# Patient Record
Sex: Male | Born: 1950 | Race: White | Hispanic: No | Marital: Married | Smoking: Never smoker
Health system: Southern US, Community
[De-identification: ages and names within clinical notes are randomized; demographics above are authoritative.]

## PROBLEM LIST (undated history)

## (undated) DIAGNOSIS — S43006A Unspecified dislocation of unspecified shoulder joint, initial encounter: Secondary | ICD-10-CM

---

## 2006-12-13 ENCOUNTER — Emergency Department (HOSPITAL_COMMUNITY): Admission: EM | Admit: 2006-12-13 | Discharge: 2006-12-13 | Payer: Self-pay | Admitting: Emergency Medicine

## 2008-01-26 IMAGING — CR DG SHOULDER 2+V*R*
2 series · 2 of 2 positions shown · non-contrast
Comparison: Films earlier in the day.

CLINICAL DATA: 55-year-old with right shoulder dislocation. Status post reduction.  Pain. 
 RIGHT SHOULDER - 2 VIEW:

[view not recorded (1 of 2)]
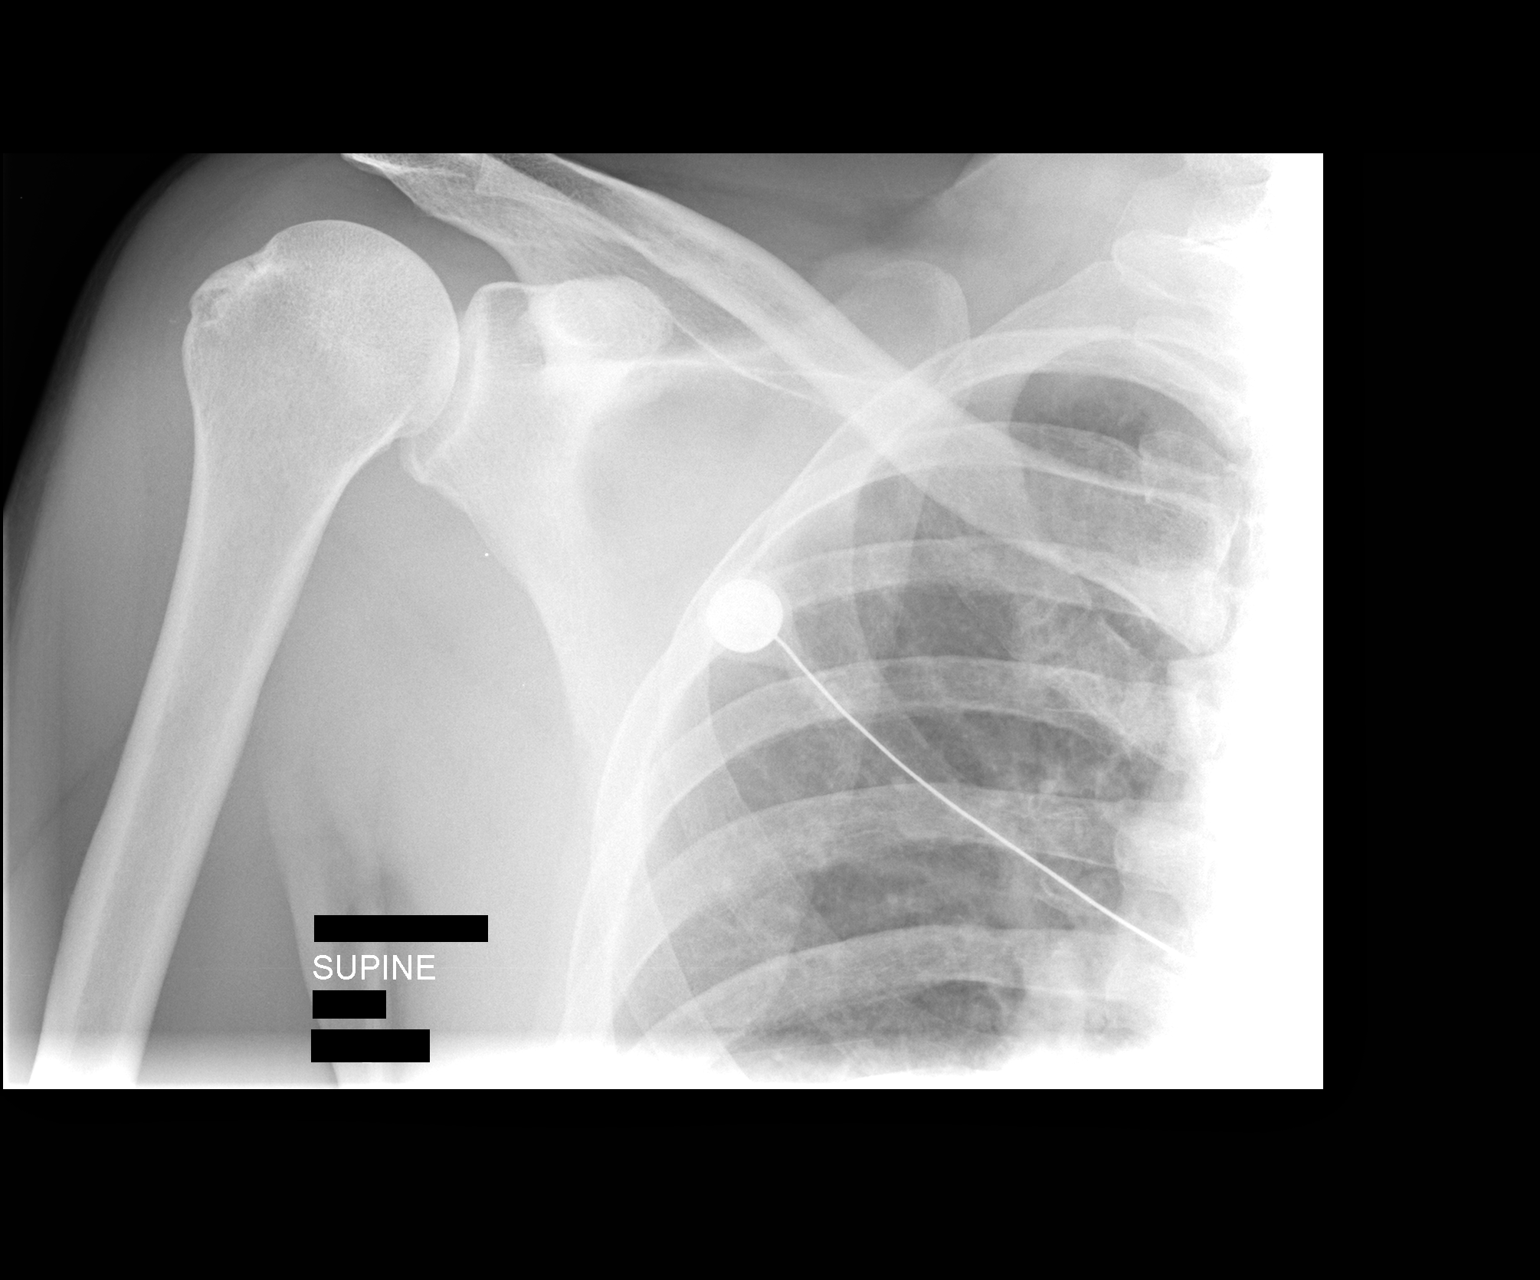

[view not recorded (2 of 2)]
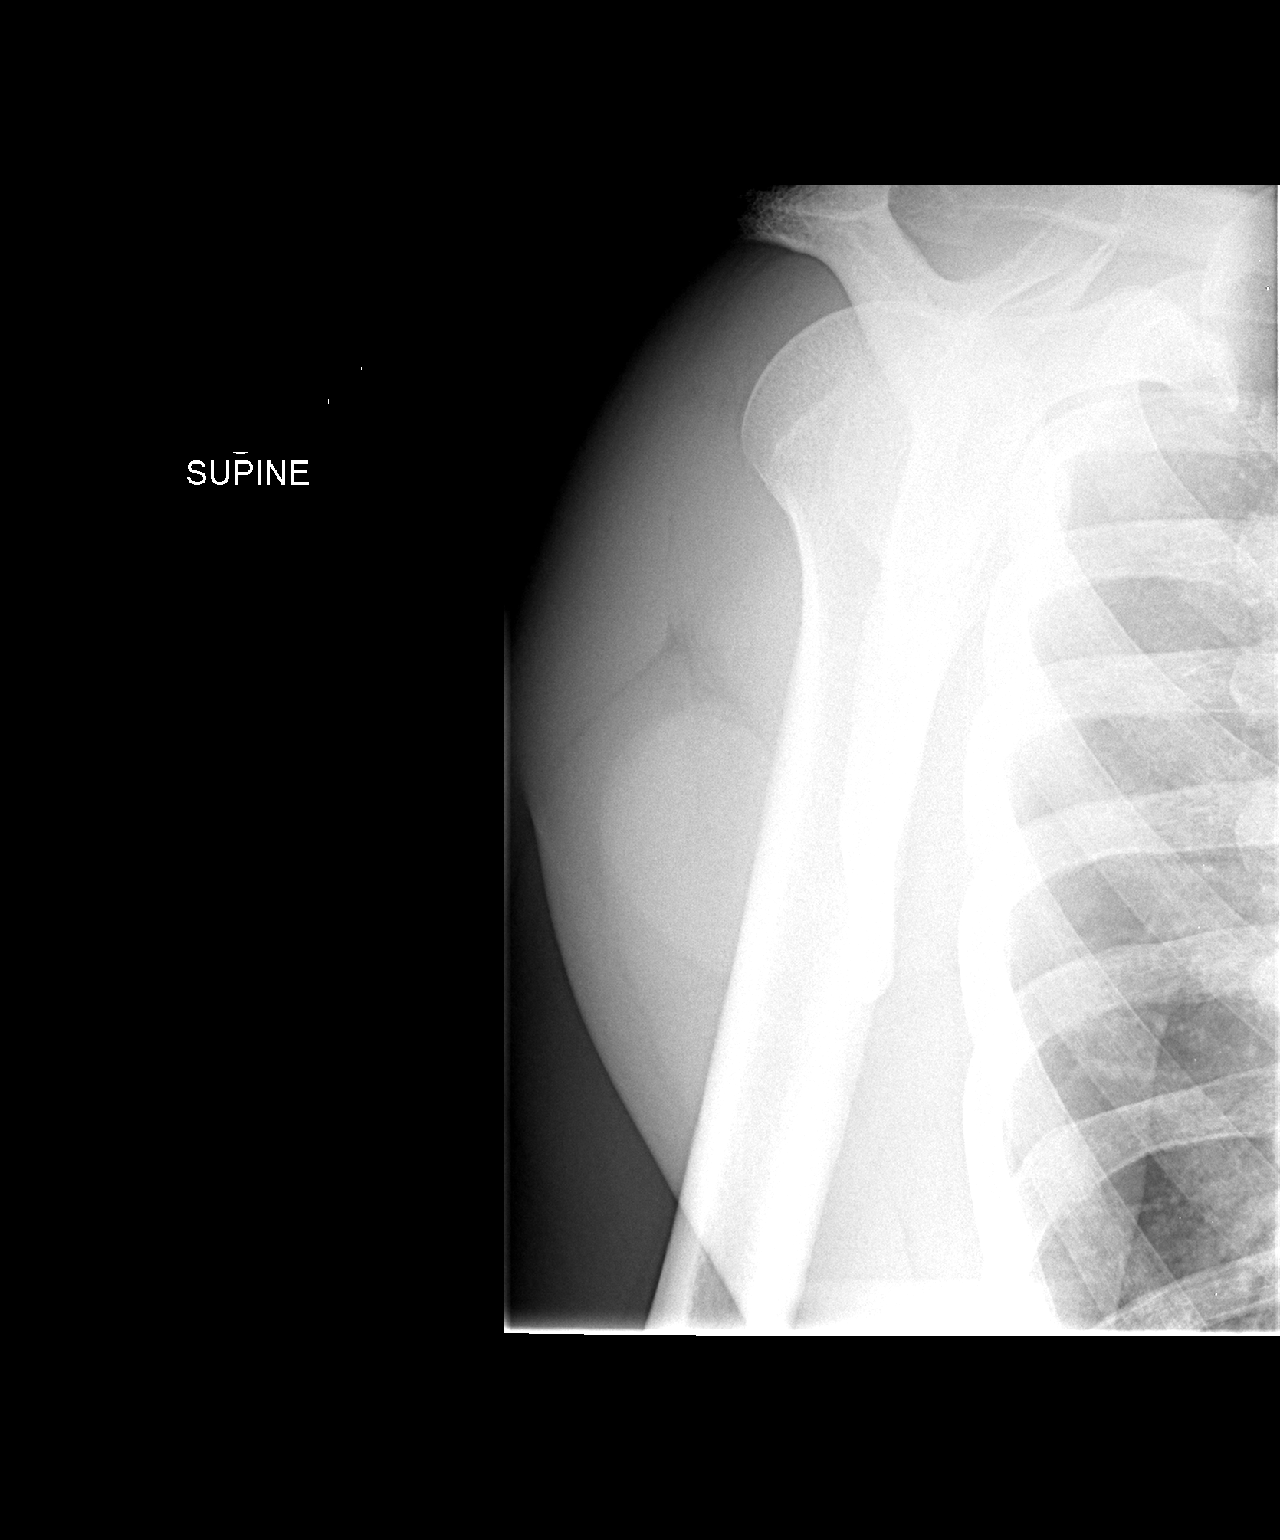

[2 of 2 positions shown; findings below may reference images not displayed]

FINDINGS: There has been interval reduction of the right shoulder.  A Hill-Sachs deformity is noted in the humeral head.
IMPRESSION: Interval reduction, right humerus.

## 2008-01-26 IMAGING — CR DG SHOULDER 2+V*R*
2 series · 2 of 2 positions shown · non-contrast
Comparison: none

CLINICAL DATA: Fall, shoulder pain

RIGHT SHOULDER - 2 VIEW

[w shoulder ap internal right *]
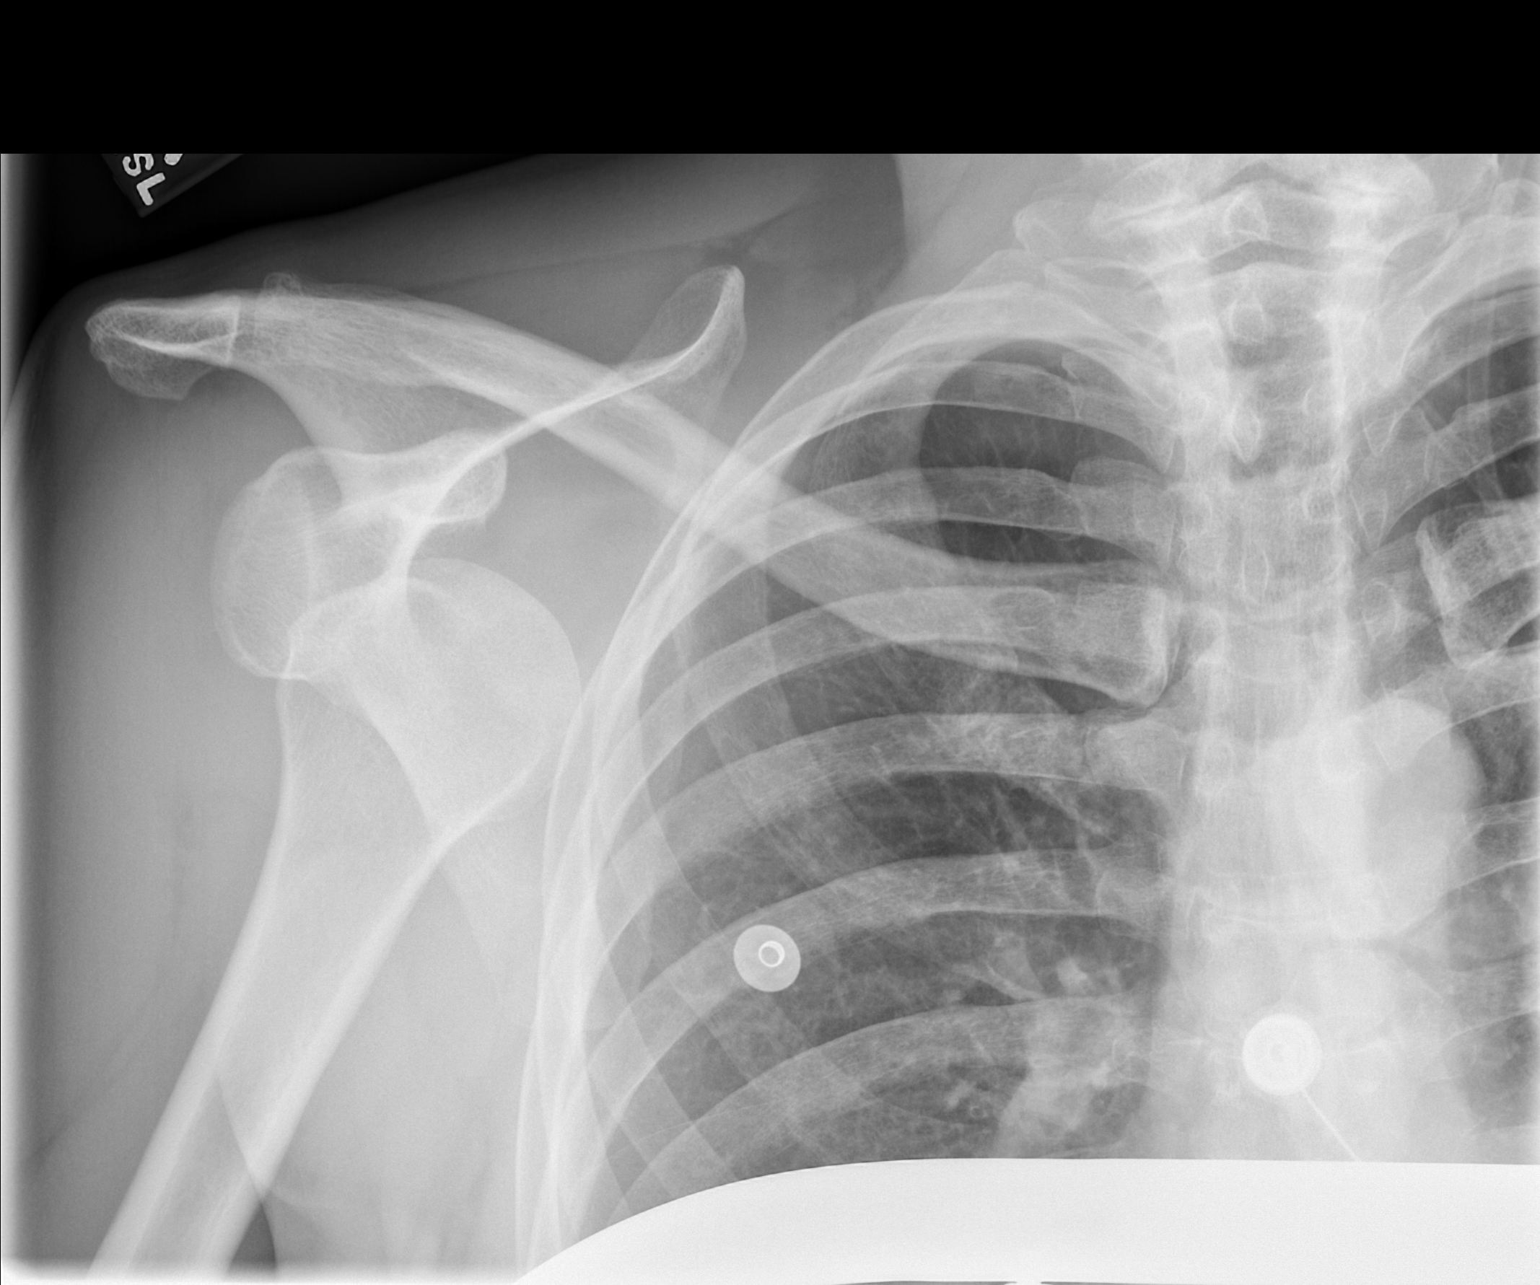

[w shoulder y view right]
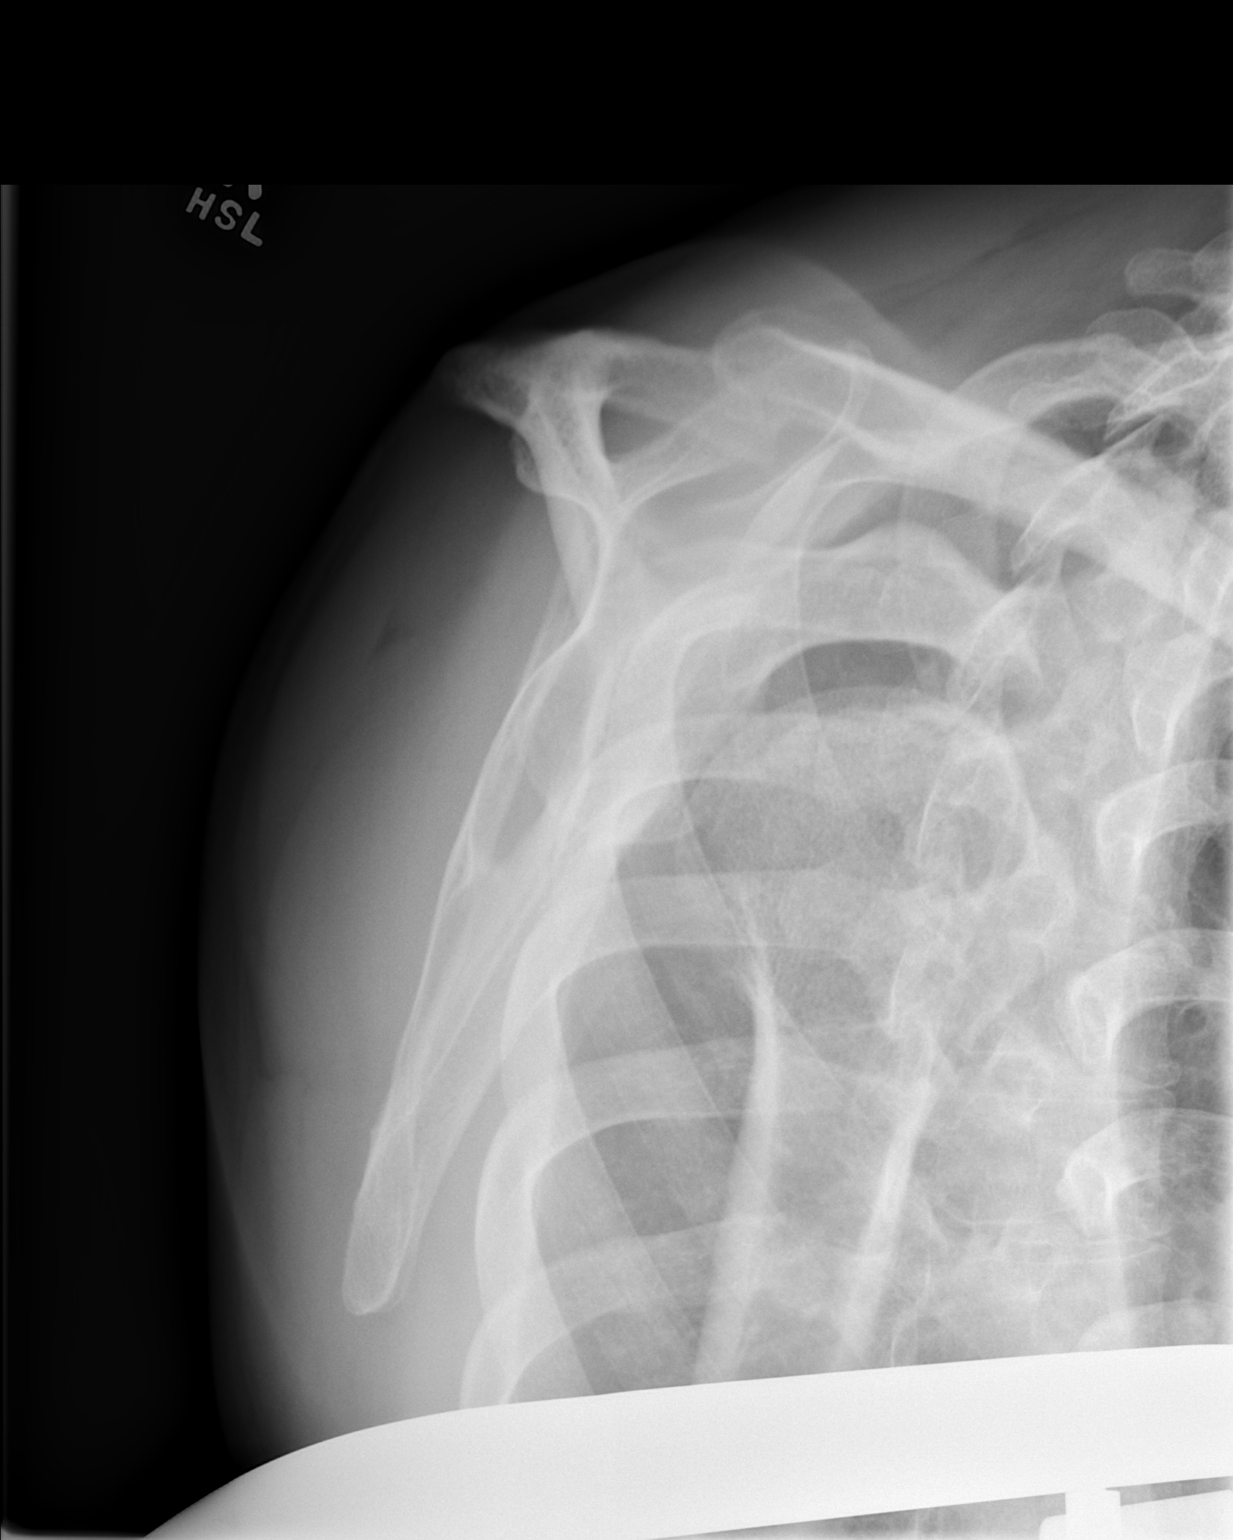

[2 of 2 positions shown; findings below may reference images not displayed]

FINDINGS: There is anterior right shoulder dislocation. No fracture seen.

IMPRESSION

Anterior right shoulder dislocation.

## 2014-04-12 ENCOUNTER — Encounter (HOSPITAL_COMMUNITY): Payer: Self-pay | Admitting: Emergency Medicine

## 2014-04-12 ENCOUNTER — Emergency Department (HOSPITAL_COMMUNITY)
Admission: EM | Admit: 2014-04-12 | Discharge: 2014-04-13 | Disposition: A | Discharge status: Home / Self Care | Payer: 59 | Attending: Emergency Medicine | Admitting: Emergency Medicine

## 2014-04-12 DIAGNOSIS — K920 Hematemesis: Secondary | ICD-10-CM | POA: Diagnosis not present

## 2014-04-12 DIAGNOSIS — Z8781 Personal history of (healed) traumatic fracture: Secondary | ICD-10-CM | POA: Diagnosis not present

## 2014-04-12 DIAGNOSIS — R110 Nausea: Secondary | ICD-10-CM | POA: Insufficient documentation

## 2014-04-12 HISTORY — DX: Unspecified dislocation of unspecified shoulder joint, initial encounter: S43.006A

## 2014-04-12 NOTE — ED Notes (Signed)
Pt states he has a hx where he often vomits if he eats too fast. Tonight this happened while he was eating and he had one episode where he vomited bright red blood. Pt has not vomited since. Denies n/v now.

## 2014-04-13 ENCOUNTER — Emergency Department (HOSPITAL_COMMUNITY): Payer: 59

## 2014-04-13 LAB — I-STAT CHEM 8, ED
BUN: 19 mg/dL (ref 6–23)
CREATININE: 0.9 mg/dL (ref 0.50–1.35)
Calcium, Ion: 1.17 mmol/L (ref 1.13–1.30)
Chloride: 102 mEq/L (ref 96–112)
GLUCOSE: 96 mg/dL (ref 70–99)
HCT: 47 % (ref 39.0–52.0)
Hemoglobin: 16 g/dL (ref 13.0–17.0)
Potassium: 4.1 mEq/L (ref 3.7–5.3)
SODIUM: 140 meq/L (ref 137–147)
TCO2: 26 mmol/L (ref 0–100)

## 2014-04-13 LAB — I-STAT TROPONIN, ED: Troponin i, poc: 0 ng/mL (ref 0.00–0.08)

## 2014-04-13 MED ORDER — OMEPRAZOLE 20 MG PO CPDR: 20.0000 mg | DELAYED_RELEASE_CAPSULE | Freq: Every day | ORAL | Status: AC

## 2014-04-13 NOTE — Discharge Instructions (Signed)
Hematemesis °This condition is the vomiting of blood. °CAUSES  °This can happen if you have a peptic ulcer or an irritation of the throat, stomach, or small bowel. Vomiting over and over again or swallowing blood from a nosebleed, coughing or facial injury can also result in bloody vomit. Anti-inflammatory pain medicines are a common cause of this potentially dangerous condition. The most serious causes of vomiting blood include: °· Ulcers (a bacteria called H. pylori is common cause of ulcers). °· Clotting problems. °· Alcoholism. °· Cirrhosis. °TREATMENT  °Treatment depends on the cause and the severity of the bleeding. Small amounts of blood streaks in the vomit is not the same as vomiting large amounts of bloody or dark, coffee grounds-like material. Weakness, fainting, dehydration, anemia, and continued alcohol or drug use increase the risk. Examination may include blood, vomit, or stool tests. The presence of bloody or dark stool that tests positive for blood (Hemoccult) means the bleeding has been going on for some time. Endoscopy and imaging studies may be done. Emergency treatment may include: °· IV medicines or fluids. °· Blood transfusions. °· Surgery. °Hospital care is required for high risk patients or when IV fluids or blood is needed. Upper GI bleeding can cause shock and death if not controlled. °HOME CARE INSTRUCTIONS  °· Your treatment does not require hospital care at this time. °· Remain at rest until your condition improves. °· Drink clear liquids as tolerated. °· Avoid: °¨ Alcohol. °¨ Nicotine. °¨ Aspirin. °¨ Any other anti-inflammatory medicine (ibuprofen, naproxen, and many others). °· Medications to suppress stomach acid or vomiting may be needed. Take all your medicine as prescribed. °· Be sure to see your caregiver for follow-up as recommended. °SEEK IMMEDIATE MEDICAL CARE IF:  °· You have repeated vomiting, dehydration, fainting, or extreme weakness. °· You are vomiting large amounts of  bloody or dark material. °· You pass large, dark or bloody stools. °Document Released: 10/10/2004 Document Revised: 11/25/2011 Document Reviewed: 10/26/2008 °ExitCare® Patient Information ©2015 [employer]. This information is not intended to replace advice given to you by your health care provider. Make sure you discuss any questions you have with your health care provider. ° °

## 2014-04-13 NOTE — ED Provider Notes (Signed)
TIME SEEN: 12:09 AM  CHIEF COMPLAINT: Hematemesis  HPI: Patient is a 63 year old male with no significant past medical history who presents to the emergency department with complaints of hematemesis. He states that he often vomits if he eats too fast as he gets a fullness in his epigastric and chest region that is relieved with vomiting. He states this happened tonight after eating and he vomited in one episode had several teaspoonfuls of bright red blood. He states after he vomited he felt much better. He is having some epigastric and chest "soreness". No shortness of breath, diaphoresis or dizziness. Denies a history of prior GI bleed, anticoagulation use, heavy alcohol or NSAID use, peptic ulcer disease, H. pylori. Denies any bloody stool or melena. He states he is feeling much better but then he started to look at his symptoms on the Internet which made him concerned and that is why he came to the emergency department.  ROS: See HPI Constitutional: no fever  Eyes: no drainage  ENT: no runny nose   Cardiovascular:  no chest pain  Resp: no SOB  GI:  vomiting GU: no dysuria Integumentary: no rash  Allergy: no hives  Musculoskeletal: no leg swelling  Neurological: no slurred speech ROS otherwise negative  PAST MEDICAL HISTORY/PAST SURGICAL HISTORY:  Past Medical History  Diagnosis Date  . Dislocated shoulder     MEDICATIONS:  Prior to Admission medications   Not on File    ALLERGIES:  No Known Allergies  SOCIAL HISTORY:  History  Substance Use Topics  . Smoking status: Never Smoker   . Smokeless tobacco: Never Used  . Alcohol Use: No    FAMILY HISTORY: No family history on file.  EXAM: BP 120/62  Pulse 65  Temp(Src) 97.8 F (36.6 C) (Oral)  Resp 18  Ht 5' 6.5" (1.689 m)  Wt 135 lb (61.236 kg)  BMI 21.47 kg/m2  SpO2 97% CONSTITUTIONAL: Alert and oriented and responds appropriately to questions. Well-appearing; well-nourished HEAD: Normocephalic EYES:  Conjunctivae clear, PERRL ENT: normal nose; no rhinorrhea; moist mucous membranes; pharynx without lesions noted NECK: Supple, no meningismus, no LAD  CARD: RRR; S1 and S2 appreciated; no murmurs, no clicks, no rubs, no gallops RESP: Normal chest excursion without splinting or tachypnea; breath sounds clear and equal bilaterally; no wheezes, no rhonchi, no rales,  ABD/GI: Normal bowel sounds; non-distended; soft, non-tender, no rebound, no guarding BACK:  The back appears normal and is non-tender to palpation, there is no CVA tenderness EXT: Normal ROM in all joints; non-tender to palpation; no edema; normal capillary refill; no cyanosis    SKIN: Normal color for age and race; warm NEURO: Moves all extremities equally PSYCH: The patient's mood and manner are appropriate. Grooming and personal hygiene are appropriate.  MEDICAL DECISION MAKING: Patient here with one episode of hematemesis. He is otherwise well-appearing in no complaints of chest soreness currently. Episode happened around 7 to 8 PM tonight. 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 Patient is an avid cycler and states he normally rides 1000 miles in the summer and never develops any symptoms during his activity including chest pain or chest discomfort, shortness of breath, nausea vomiting, diaphoresis or dizziness. His acute abdominal series shows no signs of free air under the diaphragm to suggest a perforated ulcer. He denies wanting GI cocktail, Zofran at this time. Suspect possible esophageal irritation, Mallory-Weiss tears. I am not concerned for esophageal rupture. Very low suspicion for ACS but labs  including one troponin pending given his abnormal EKG with no old for comparison.  ED PROGRESS: Patient's labs are unremarkable. Troponin negative. Given this episode happened ever 5 hours ago and he has a negative troponin, if he is safe to be discharged home. I also discussed with patient and feels  very likely that he has a significant coronary artery disease as he is able to vigerously exercise on a regular basis without becoming symptomatic.  We'll discharge patient home with prescription for omeprazole. We'll have him follow up with gastroenterology if symptoms continue. Have discussed return precautions and supportive care instructions. He verbalizes understanding and is comfortable plan.     EKG Interpretation  Date/Time:  Wednesday April 13 2014 00:25:38 EDT Ventricular Rate:  57 PR Interval:  144 QRS Duration: 152 QT Interval:  443 QTC Calculation: 431 R Axis:   121 Text Interpretation:  Sinus rhythm RBBB and LPFB No old for comparison Confirmed by Lorijean Husser,  DO, Caela Huot [phone]([id number]) on 04/13/2014 12:36:28 AM         Layla MawKristen N Xaine Sansom, DO 04/13/14 0122

## 2020-01-10 ENCOUNTER — Ambulatory Visit: Payer: Self-pay | Attending: Internal Medicine

## 2020-01-10 DIAGNOSIS — Z23 Encounter for immunization: Secondary | ICD-10-CM

## 2020-01-10 NOTE — Progress Notes (Signed)
   Covid-19 Vaccination Clinic  Name:  Dominic Marks    MRN: [id number] DOB: May 20, 1951  01/10/2020  Mr. [name] was observed post Covid-19 immunization for 15 minutes without incident. He was provided with Vaccine Information Sheet and instruction to access the V-Safe system.   Mr. [name] was instructed to call 911 with any severe reactions post vaccine: Marland Kitchen Difficulty breathing  . Swelling of face and throat  . A fast heartbeat  . A bad rash all over body  . Dizziness and weakness   Immunizations Administered    Name Date Dose VIS Date Route   Pfizer COVID-19 Vaccine 01/10/2020  4:02 PM 0.3 mL 11/10/2018 Intramuscular   Manufacturer: [employer], [employer]   Lot: [id number]   NDC: [address]-9

## 2020-01-31 ENCOUNTER — Ambulatory Visit: Payer: Self-pay | Attending: Internal Medicine

## 2020-01-31 DIAGNOSIS — Z23 Encounter for immunization: Secondary | ICD-10-CM

## 2020-01-31 NOTE — Progress Notes (Signed)
   Covid-19 Vaccination Clinic  Name:  Dominic Marks    MRN: [id number] DOB: [birth year]  01/31/2020  [name] was observed post Covid-19 immunization for 15 minutes without incident. He was provided with Vaccine Information Sheet and instruction to access the V-Safe system.   [name] was instructed to call 911 with any severe reactions post vaccine: Marland Kitchen Difficulty breathing  . Swelling of face and throat  . A fast heartbeat  . A bad rash all over body  . Dizziness and weakness   Immunizations Administered    Name Date Dose VIS Date Route   Pfizer COVID-19 Vaccine 01/31/2020  4:16 PM 0.3 mL 11/10/2018 Intramuscular   Manufacturer: [employer], [employer]   Lot: [id number]   NDC: [address]-7

## 2020-08-08 ENCOUNTER — Ambulatory Visit: Payer: Medicare Other | Attending: Internal Medicine

## 2020-08-08 DIAGNOSIS — Z23 Encounter for immunization: Secondary | ICD-10-CM

## 2020-08-08 NOTE — Progress Notes (Signed)
   Covid-19 Vaccination Clinic  Name:  Dominic Marks    MRN: [id number] DOB: December 19, 1950  08/08/2020  [name] was observed post Covid-19 immunization for 15 minutes without incident. He was provided with Vaccine Information Sheet and instruction to access the V-Safe system.   [name] was instructed to call 911 with any severe reactions post vaccine: Marland Kitchen Difficulty breathing  . Swelling of face and throat  . A fast heartbeat  . A bad rash all over body  . Dizziness and weakness   Immunizations Administered    Name Date Dose VIS Date Route   Pfizer COVID-19 Vaccine 08/08/2020  1:38 PM 0.3 mL 07/05/2020 Intramuscular   Manufacturer: [employer], [employer]   Lot: [id number]   NDC: [address]-1

## 2023-01-02 ENCOUNTER — Other Ambulatory Visit: Payer: Self-pay

## 2023-01-02 DIAGNOSIS — M79672 Pain in left foot: Secondary | ICD-10-CM

## 2023-01-15 ENCOUNTER — Ambulatory Visit (HOSPITAL_BASED_OUTPATIENT_CLINIC_OR_DEPARTMENT_OTHER)
Admission: RE | Admit: 2023-01-15 | Discharge: 2023-01-15 | Disposition: A | Discharge status: Home / Self Care | Payer: Medicare Other | Source: Ambulatory Visit | Attending: Orthopaedic Surgery | Admitting: Orthopaedic Surgery

## 2023-01-15 DIAGNOSIS — M79672 Pain in left foot: Secondary | ICD-10-CM | POA: Diagnosis present

## 2023-06-27 ENCOUNTER — Other Ambulatory Visit: Payer: Self-pay | Admitting: Orthopaedic Surgery

## 2023-06-27 DIAGNOSIS — M25562 Pain in left knee: Secondary | ICD-10-CM

## 2023-07-08 ENCOUNTER — Ambulatory Visit
Admission: RE | Admit: 2023-07-08 | Discharge: 2023-07-08 | Disposition: A | Discharge status: Home / Self Care | Payer: Medicare Other | Source: Ambulatory Visit | Attending: Orthopaedic Surgery | Admitting: Orthopaedic Surgery

## 2023-07-08 DIAGNOSIS — M25562 Pain in left knee: Secondary | ICD-10-CM

## 2023-08-26 ENCOUNTER — Encounter: Payer: Self-pay | Admitting: Podiatry

## 2023-08-26 ENCOUNTER — Ambulatory Visit (INDEPENDENT_AMBULATORY_CARE_PROVIDER_SITE_OTHER): Payer: Medicare Other

## 2023-08-26 ENCOUNTER — Ambulatory Visit (INDEPENDENT_AMBULATORY_CARE_PROVIDER_SITE_OTHER): Payer: Medicare Other | Admitting: Podiatry

## 2023-08-26 VITALS — Ht 66.5 in | Wt 135.0 lb

## 2023-08-26 DIAGNOSIS — M21272 Flexion deformity, left ankle and toes: Secondary | ICD-10-CM | POA: Diagnosis not present

## 2023-08-26 DIAGNOSIS — M2142 Flat foot [pes planus] (acquired), left foot: Secondary | ICD-10-CM | POA: Diagnosis not present

## 2023-08-26 DIAGNOSIS — M778 Other enthesopathies, not elsewhere classified: Secondary | ICD-10-CM | POA: Diagnosis not present

## 2023-08-26 DIAGNOSIS — M6281 Muscle weakness (generalized): Secondary | ICD-10-CM

## 2023-08-26 NOTE — Progress Notes (Signed)
  Subjective:  Patient ID: Dominic Marks, male    DOB: 01/20/1951,  MRN: [id number]  Chief Complaint  Patient presents with   Foot Pain    RM2: pain primarily upper part of left foot...    Discussed the use of AI scribe software for clinical note transcription with the patient, who gave verbal consent to proceed.  History of Present Illness   The patient, with a history of two bunion surgeries, presents with persistent foot pain. The first surgery resulted in a nonunion, while the second was initially successful. However, the patient reports that since the last surgery around [facility], he has experienced significant discomfort in the foot and knee. The patient describes the foot pain as a strain-like sensation, primarily on the top of the foot, which intensifies with physical activity such as hiking. The pain sometimes presents spontaneously, described as an intense nerve-like sensation. The patient also reports occasional swelling on the side of the foot. The patient attempted to use shoe inserts, which seemed to exacerbate the issue. The patient also mentions sensitivity around the hardware from the bunion surgeries.          Objective:    Physical Exam   MUSCULOSKELETAL: Well-healed surgical scar from first MTP arthrodesis. Tenderness along the EHL with range of motion of the IPJ. Mild midfoot tenderness and tenderness on the PD tendon and insertion. Immediate post-surgical valgus deformity. No severe pain in the second or third metatarsal to indicate active stress fracture.       No images are attached to the encounter.    Results   RADIOLOGY Left foot X-ray: Well-healed first metatarsal phalangeal arthrodesis with plate and screw fixation. Metatarsus primus elevatus present. Mild pes planus deformity.      Assessment:   1. Capsulitis of left foot   2. Metatarsus primus elevatus, left   3. Acquired pes planus, left   4. Muscle weakness      Plan:  Patient was  evaluated and treated and all questions answered.  Assessment and Plan    Post-operative foot pain   Following bunion surgeries, the most recent in May 2024, he experiences pain and discomfort primarily on the top of the foot, worsening with physical activity. Tenderness is noted along the Extensor Hallucis Longus (EHL) during range of motion of the Interphalangeal Joint (IPJ) and upon palpation over the hardware, alongside mild midfoot tenderness and tenderness on the Posterior Tibial (PT) tendon and its insertion. We will refer him to an orthotist for custom molded inserts to support the ankle and alleviate tendon strain. Should pain persist, consideration for hardware removal is advised. We also discussed physical therapy for conditioning and strengthening and a referral was sent.  Pes Planus (Flatfoot)   His flatter foot, where the arch rolls in, adds strain on the PT tendon. Custom molded inserts will be used to support the arch and prevent the foot from rolling in.  Metatarsus Primus Elevatus   The elevation of the first metatarsal bone may be contributing to his foot pain and strain. Custom molded inserts will also address this by balancing the back of the ankle and the middle of the foot.  Follow-up   An appointment with the orthotist will be scheduled to discuss and create custom molded inserts. He should bring his current inserts for assessment. A future consultation for hardware removal may be considered if necessary.          No follow-ups on file.

## 2023-09-16 ENCOUNTER — Ambulatory Visit: Payer: Medicare Other | Attending: Podiatry | Admitting: Physical Therapy

## 2023-09-16 ENCOUNTER — Encounter: Payer: Self-pay | Admitting: Physical Therapy

## 2023-09-16 ENCOUNTER — Other Ambulatory Visit: Payer: Self-pay

## 2023-09-16 DIAGNOSIS — M25572 Pain in left ankle and joints of left foot: Secondary | ICD-10-CM | POA: Diagnosis present

## 2023-09-16 DIAGNOSIS — M6281 Muscle weakness (generalized): Secondary | ICD-10-CM

## 2023-09-16 DIAGNOSIS — M25562 Pain in left knee: Secondary | ICD-10-CM | POA: Insufficient documentation

## 2023-09-16 DIAGNOSIS — G8929 Other chronic pain: Secondary | ICD-10-CM | POA: Diagnosis present

## 2023-09-16 NOTE — Patient Instructions (Signed)
 Access Code: [email or web address] URL: [email or web address] Date: 09/16/2023 Prepared by: Elaine Daring  Exercises - Seated Figure 4 Ankle Inversion with Resistance  - 1 x daily - 3 sets - 15-20 reps - Long Sitting Ankle Eversion with Resistance  - 1 x daily - 3 sets - 15-20 reps - Supine Bridge with Mini Swiss Ball Between Knees  - 1 x daily - 2 sets - 10 reps - 5 seconds hold - Side Plank on Knees  - 1 x daily - 2 sets - 10 reps - 5 seconds hold

## 2023-09-16 NOTE — Therapy (Signed)
 OUTPATIENT PHYSICAL THERAPY EVALUATION   Patient Name: Dominic Marks MRN: [id number] DOB:1951/02/24, 72 y.o., male Today's Date: 09/16/2023   END OF SESSION:  PT End of Session - 09/16/23 1205     Visit Number 1    Number of Visits 9    Date for PT Re-Evaluation 11/11/23    Authorization Type MCR / BCBS    Progress Note Due on Visit 10    PT Start Time 0845    PT Stop Time 0935    PT Time Calculation (min) 50 min    Activity Tolerance Patient tolerated treatment well    Behavior During Therapy WFL for tasks assessed/performed             Past Medical History:  Diagnosis Date   Dislocated shoulder    History reviewed. No pertinent surgical history. There are no active problems to display for this patient.   PCP: Tanda Prentice DEL, MD  REFERRING PROVIDER: Silva Juliene SAUNDERS, DPM  REFERRING DIAG: Muscle weakness  THERAPY DIAG:  Pain in left ankle and joints of left foot  Chronic pain of left knee  Muscle weakness (generalized)  Rationale for Evaluation and Treatment: Rehabilitation  ONSET DATE: November 2023   SUBJECTIVE:  SUBJECTIVE STATEMENT: Patient reports he had a bunion surgery in November 2023, nonunion and had a re-do in May 2024. As a consequence of the bunion surgery he is experiencing significant pain in the left foot and meniscus tears in his left knee. He feels he has lost a lot of muscle which is putting more stress on the left foot. He does wear decent shoes and only does some low grade hiking for exercises. He does constantly feel pain with walking and whenever he gets up from sitting it takes about 20 steps for the ankle/foot to warm up.   PERTINENT HISTORY: Two previous left bunion surgeries, left meniscus tear  PAIN:  Are you having pain? Yes:  NPRS scale: 5/10 Pain location: Left foot, knee Pain description: Aching, sharp Aggravating factors: Walking, standing Relieving factors: Rest  PRECAUTIONS: None  RED FLAGS: None   WEIGHT  BEARING RESTRICTIONS: No  FALLS:  Has patient fallen in last 6 months? No  PLOF: Independent  PATIENT GOALS: Pain relief so can improve activity level   OBJECTIVE:  Note: Objective measures were completed at Evaluation unless otherwise noted. PATIENT SURVEYS:  FOTO 57% functional status  COGNITION: Overall cognitive status: Within functional limits for tasks assessed     SENSATION: WFL  MUSCLE LENGTH: Left calf tightness  POSTURE:   Patient demonstrates pes planus of left foot  PALPATION: Tender to palpation across   LOWER EXTREMITY ROM:  Active ROM Right eval Left eval  Hip flexion    Hip extension    Hip abduction    Hip adduction    Hip internal rotation    Hip external rotation    Knee flexion  135  Knee extension  0  Ankle dorsiflexion  8  Ankle plantarflexion  55  Ankle inversion  35  Ankle eversion  15   (Blank rows = not tested)  Patient with fusion 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 so unable to flex/extend joint  LOWER EXTREMITY MMT:  MMT Right eval Left eval  Hip flexion    Hip extension 4- 4-  Hip abduction 4 4-  Hip adduction 4 4  Hip internal rotation    Hip external rotation    Knee flexion 5 5  Knee extension 5 5  Ankle  dorsiflexion 5 5  Ankle plantarflexion 5 4  Ankle inversion 5 4  Ankle eversion 5 4   (Blank rows = not tested)  FUNCTIONAL TESTS:  Not assessed  GAIT: Assistive device utilized: None Level of assistance: Complete Independence Comments: Left toe out, impaired toe off on left                                                                                                              TREATMENT DATE:  [facility] Adult PT Treatment:                                                DATE: 09/16/2023 Therapeutic Exercise: Seated figure-4 ankle inversion with red  Longsitting ankle eversion with red Bridge with 5 sec hold Modified side plank lift on knee with 5 sec hold  PATIENT EDUCATION:  Education details: Exam findings,  POC, HEP, possible use of shoe insert or rockerbottom shoe due to 1st MTP fusion Person educated: Patient Education method: Programmer, Multimedia, Demonstration, Tactile cues, Verbal cues, and Handouts Education comprehension: verbalized understanding, returned demonstration, verbal cues required, tactile cues required, and needs further education  HOME EXERCISE PROGRAM: Access Code: [email or web address]    ASSESSMENT: CLINICAL IMPRESSION: Patient is a 72 y.o. male who was seen today for physical therapy evaluation and treatment for left foot and knee pain with left LE weakness following multiple left bunion surgeries and left knee meniscus tear. He primarily reports pain across the metatarsals on the left foot and pain along the medial aspect of the left arch and ankle. He does exhibit some strength deficits of the left ankle and LE compared to the right side, and exhibits limitations in his ankle mobility and fusion of the left 1st MTP this is likely a main contributor to his symptoms due to gait and movement changes. Patient does report he is likely to schedule surgery for hardware removal of left bunion surgery and left meniscectomy.    OBJECTIVE IMPAIRMENTS: Abnormal gait, decreased activity tolerance, decreased balance, decreased ROM, decreased strength, impaired flexibility, and pain.   ACTIVITY LIMITATIONS: standing and locomotion level  PARTICIPATION LIMITATIONS: community activity  PERSONAL FACTORS: Past/current experiences and Time since onset of injury/illness/exacerbation are also affecting patient's functional outcome.   REHAB POTENTIAL: Good  CLINICAL DECISION MAKING: Stable/uncomplicated  EVALUATION COMPLEXITY: Low   GOALS: Goals reviewed with patient? Yes  SHORT TERM GOALS: Target date: 10/14/2023  Patient will be I with initial HEP in order to progress with therapy. Baseline: HEP provided at eval Goal status: INITIAL  2.  Patient will report left foot and knee pain </= 2/10 with  walking in order to reduce functional limitations Baseline: 5/10 pain Goal status: INITIAL  LONG TERM GOALS: Target date: 11/11/2023  Patient will be I with final HEP to maintain progress from PT. Baseline: HEP provided at eval Goal status: INITIAL  2.  Patient will report >/= 70%  status on FOTO to indicate improved functional ability. Baseline: 57% functional status Goal status: INITIAL  3.  Patient will demonstrate left ankle strength 5/5 MMT in order to improve standing and walking tolerance Baseline: see limitations above Goal status: INITIAL  4.  Patient will demonstrate left hip strength >/= 4/5 MMT in order to improve activity tolerance Baseline: see limitations above Goal status: INITIAL   PLAN: PT FREQUENCY: 1x/week  PT DURATION: 8 weeks  PLANNED INTERVENTIONS: 97164- PT Re-evaluation, 97110-Therapeutic exercises, 97530- Therapeutic activity, 97112- Neuromuscular re-education, 97535- Self Care, 02859- Manual therapy, [id number]- Gait training, 97014- Electrical stimulation (unattended), [phone]- Electrical stimulation (manual), [phone]- Ionotophoresis 4mg /ml Dexamethasone, Patient/Family education, Balance training, Taping, Dry Needling, Joint mobilization, Joint manipulation, Cryotherapy, and Moist heat  PLAN FOR NEXT SESSION: Review HEP and progress PRN, progress strengthening for left ankle, knee, hip, core; ankle and foot mobility; progress to standing control exercises   Elaine Daring, PT, DPT, LAT, ATC 09/16/23  12:59 PM Phone: [phone] Fax: [phone]

## 2023-09-24 ENCOUNTER — Other Ambulatory Visit: Payer: Self-pay

## 2023-09-24 ENCOUNTER — Encounter: Payer: Self-pay | Admitting: Physical Therapy

## 2023-09-24 ENCOUNTER — Ambulatory Visit: Payer: Medicare Other | Attending: Podiatry | Admitting: Physical Therapy

## 2023-09-24 DIAGNOSIS — G8929 Other chronic pain: Secondary | ICD-10-CM | POA: Insufficient documentation

## 2023-09-24 DIAGNOSIS — M6281 Muscle weakness (generalized): Secondary | ICD-10-CM | POA: Insufficient documentation

## 2023-09-24 DIAGNOSIS — M25562 Pain in left knee: Secondary | ICD-10-CM | POA: Diagnosis present

## 2023-09-24 DIAGNOSIS — M25572 Pain in left ankle and joints of left foot: Secondary | ICD-10-CM | POA: Diagnosis present

## 2023-09-24 NOTE — Patient Instructions (Signed)
 Access Code: [email or web address] URL: [email or web address] Date: 09/24/2023 Prepared by: Elaine Daring  Exercises - Seated Figure 4 Ankle Inversion with Resistance  - 1 x daily - 3 sets - 20 reps - Long Sitting Ankle Eversion with Resistance  - 1 x daily - 3 sets - 20 reps - Long Sitting Ankle Plantar Flexion with Resistance  - 1 x daily - 3 sets - 20 reps - Supine Bridge with Mini Swiss Ball Between Knees  - 1 x daily - 2 sets - 10 reps - 5 seconds hold - Side Plank with Clam and Resistance  - 1 x daily - 2 sets - 10 reps - Single Leg Stance  - 1 x daily - 3 reps - 30 seconds hold

## 2023-09-24 NOTE — Therapy (Signed)
 OUTPATIENT PHYSICAL THERAPY TREATMENT   Patient Name: Dominic Marks MRN: [id number] DOB:06/21/1951, 73 y.o., male Today's Date: 09/24/2023   END OF SESSION:  PT End of Session - 09/24/23 0856     Visit Number 2    Number of Visits 9    Date for PT Re-Evaluation 11/11/23    Authorization Type MCR / BCBS    Progress Note Due on Visit 10    PT Start Time 0845    PT Stop Time 0928    PT Time Calculation (min) 43 min    Activity Tolerance Patient tolerated treatment well    Behavior During Therapy [facility] for tasks assessed/performed              Past Medical History:  Diagnosis Date   Dislocated shoulder    History reviewed. No pertinent surgical history. There are no active problems to display for this patient.   PCP: Tanda Prentice DEL, MD  REFERRING PROVIDER: Silva Juliene SAUNDERS, DPM  REFERRING DIAG: Muscle weakness  THERAPY DIAG:  Pain in left ankle and joints of left foot  Chronic pain of left knee  Muscle weakness (generalized)  Rationale for Evaluation and Treatment: Rehabilitation  ONSET DATE: November 2023   SUBJECTIVE:  SUBJECTIVE STATEMENT: Patient reports the ankle strengthening exercises are going well. The core and hip exercises he can't tell much yet.   EVAL: Patient reports he had a bunion surgery in November 2023, nonunion and had a re-do in May 2024. As a consequence of the bunion surgery he is experiencing significant pain in the left foot and meniscus tears in his left knee. He feels he has lost a lot of muscle which is putting more stress on the left foot. He does wear decent shoes and only does some low grade hiking for exercises. He does constantly feel pain with walking and whenever he gets up from sitting it takes about 20 steps for the ankle/foot to warm up.   PERTINENT HISTORY: Two previous left bunion surgeries, left meniscus tear  PAIN:  Are you having pain? Yes:  NPRS scale: 5/10 Pain location: Left foot, knee Pain description:  Aching, sharp Aggravating factors: Walking, standing Relieving factors: Rest  PRECAUTIONS: None  PATIENT GOALS: Pain relief so can improve activity level   OBJECTIVE:  Note: Objective measures were completed at Evaluation unless otherwise noted. 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 so unable to flex/extend joint  LOWER EXTREMITY MMT:  MMT Right eval Left eval  Hip flexion    Hip extension 4- 4-  Hip abduction 4 4-  Hip adduction 4 4  Hip internal rotation    Hip external rotation    Knee flexion 5 5  Knee extension 5 5  Ankle dorsiflexion 5 5  Ankle plantarflexion 5 4  Ankle inversion 5 4  Ankle eversion 5 4   (  Blank rows = not tested)  FUNCTIONAL TESTS:  09/23/2022: SLS - < 30 seconds on each side, increased sway and difficulty with occasional loss of balance bilaterally   GAIT: Assistive device utilized: None Level of assistance: Complete Independence Comments: Left toe out, impaired toe off on left                                                                                                              TREATMENT DATE:  [facility] Adult PT Treatment:                                                DATE: 09/24/2023 Therapeutic Exercise: Seated figure-4 ankle inversion with red 2 x 20 Longsitting ankle eversion with red 2 x 20 Longsitting ankle PF with blue 2 x 20 Seated heel raise on 1/2 FR with 25# 2 x 20 Modified side plank with clamshell  using yellow 2 x 10 each SLS 3 x 30 sec each Manual: Left intertarsal, tarsal, and talocrural mobs Passive ankle DF   [facility] Adult PT Treatment:                                                DATE: 09/16/2023 Therapeutic Exercise: Seated figure-4 ankle inversion with red  Longsitting ankle eversion with red Bridge with 5 sec hold Modified side plank lift on knee with 5 sec hold  PATIENT EDUCATION:  Education details: HEP update Person educated: Patient Education method: Programmer, Multimedia, Demonstration, Tactile cues, Verbal cues, and Handouts Education comprehension: verbalized understanding, returned demonstration, verbal cues required, tactile cues required, and needs further education  HOME EXERCISE PROGRAM: Access Code: [email or web address]    ASSESSMENT: CLINICAL IMPRESSION: Patient tolerated therapy well with no adverse effects. Therapy focused on progressing left ankle mobility, strength, and control as well as hip and core strength progression. He was able to progress to SLS and does exhibit some difficulty with control but did improve with cueing for arch control. He did require cues for proper exercises technique. Updated his HEP to progress his strength. Patient would benefit from continued skilled PT to progress his mobility and strength in order to reduce pain and maximize functional ability.    EVAL: Patient is a 73 y.o. male who was seen today for physical therapy evaluation and treatment for left foot and knee pain with left LE weakness following multiple left bunion surgeries and left knee meniscus tear. He primarily reports pain across the metatarsals on the left foot and pain along the medial aspect of the left arch and ankle. He does exhibit some strength deficits of the left ankle and LE compared to the right side, and exhibits limitations in his ankle mobility and fusion of the left 1st MTP this is likely a main contributor to  his symptoms due to gait and movement changes. Patient does  report he is likely to schedule surgery for hardware removal of left bunion surgery and left meniscectomy.   OBJECTIVE IMPAIRMENTS: Abnormal gait, decreased activity tolerance, decreased balance, decreased ROM, decreased strength, impaired flexibility, and pain.   ACTIVITY LIMITATIONS: standing and locomotion level  PARTICIPATION LIMITATIONS: community activity  PERSONAL FACTORS: Past/current experiences and Time since onset of injury/illness/exacerbation are also affecting patient's functional outcome.    GOALS: Goals reviewed with patient? Yes  SHORT TERM GOALS: Target date: 10/14/2023  Patient will be I with initial HEP in order to progress with therapy. Baseline: HEP provided at eval Goal status: INITIAL  2.  Patient will report left foot and knee pain </= 2/10 with walking in order to reduce functional limitations Baseline: 5/10 pain Goal status: INITIAL  LONG TERM GOALS: Target date: 11/11/2023  Patient will be I with final HEP to maintain progress from PT. Baseline: HEP provided at eval Goal status: INITIAL  2.  Patient will report >/= 70% status on FOTO to indicate improved functional ability. Baseline: 57% functional status Goal status: INITIAL  3.  Patient will demonstrate left ankle strength 5/5 MMT in order to improve standing and walking tolerance Baseline: see limitations above Goal status: INITIAL  4.  Patient will demonstrate left hip strength >/= 4/5 MMT in order to improve activity tolerance Baseline: see limitations above Goal status: INITIAL   PLAN: PT FREQUENCY: 1x/week  PT DURATION: 8 weeks  PLANNED INTERVENTIONS: 97164- PT Re-evaluation, 97110-Therapeutic exercises, 97530- Therapeutic activity, 97112- Neuromuscular re-education, 97535- Self Care, [id number]- Manual therapy, [id number]- Gait training, 97014- Electrical stimulation (unattended), [phone]- Electrical stimulation (manual), [phone]- Ionotophoresis 4mg /ml Dexamethasone, Patient/Family education,  Balance training, Taping, Dry Needling, Joint mobilization, Joint manipulation, Cryotherapy, and Moist heat  PLAN FOR NEXT SESSION: Review HEP and progress PRN, progress strengthening for left ankle, knee, hip, core; ankle and foot mobility; progress to standing control exercises   Elaine Daring, PT, DPT, LAT, ATC 09/24/23  10:51 AM Phone: [phone] Fax: [phone]

## 2023-09-30 NOTE — Therapy (Signed)
 OUTPATIENT PHYSICAL THERAPY TREATMENT   Patient Name: Dominic Marks MRN: [id number] DOB:02-May-1951, 73 y.o., male Today's Date: 10/01/2023   END OF SESSION:  PT End of Session - 10/01/23 0838     Visit Number 3    Number of Visits 9    Date for PT Re-Evaluation 11/11/23    Authorization Type MCR / BCBS    PT Start Time [phone]    PT Stop Time 0930    PT Time Calculation (min) 47 min    Activity Tolerance Patient tolerated treatment well    Behavior During Therapy [facility] for tasks assessed/performed               Past Medical History:  Diagnosis Date   Dislocated shoulder    History reviewed. No pertinent surgical history. There are no active problems to display for this patient.   PCP: Tura Gaines, MD  REFERRING PROVIDER: Floyce Hutching, DPM  REFERRING DIAG: Muscle weakness  THERAPY DIAG:  Pain in left ankle and joints of left foot  Chronic pain of left knee  Muscle weakness (generalized)  Rationale for Evaluation and Treatment: Rehabilitation  ONSET DATE: November 2023  SUBJECTIVE:  SUBJECTIVE STATEMENT: Patient reported continuing to progress with HEP at home. Overall, he reports that he thinks PT is effective and he feels like he is getting stronger. Balance is the main concern still as he feels a notable difference comparing L to R. He reports SLS is very challenging in the L extremity. Pain has decreased overall.    EVAL: Patient reports he had a bunion surgery in November 2023, nonunion and had a re-do in May 2024. As a consequence of the bunion surgery he is experiencing significant pain in the left foot and meniscus tears in his left knee. He feels he has lost a lot of muscle which is putting more stress on the left foot. He does wear decent shoes and only does some low grade hiking for exercises. He does constantly feel pain with walking and whenever he gets up from sitting it takes about 20 steps for the ankle/foot to warm up.   PERTINENT  HISTORY: Two previous left bunion surgeries, left meniscus tear  PAIN:  Are you having pain? Yes:  NPRS scale: 5/10 Pain location: Left foot, knee Pain description: Aching, sharp Aggravating factors: Walking, standing Relieving factors: Rest  PRECAUTIONS: None  PATIENT GOALS: Pain relief so can improve activity level   OBJECTIVE:  Note: Objective measures were completed at Evaluation unless otherwise noted. 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 so unable to flex/extend joint  LOWER EXTREMITY MMT:  MMT Right eval Left eval  Hip flexion    Hip extension 4- 4-  Hip abduction 4 4-  Hip adduction 4 4  Hip internal rotation    Hip external rotation  Knee flexion 5 5  Knee extension 5 5  Ankle dorsiflexion 5 5  Ankle plantarflexion 5 4  Ankle inversion 5 4  Ankle eversion 5 4   (Blank rows = not tested)  FUNCTIONAL TESTS:  09/23/2022: SLS - < 30 seconds on each side, increased sway and difficulty with occasional loss of balance bilaterally   GAIT: Assistive device utilized: None Level of assistance: Complete Independence Comments: Left toe out, impaired toe off on left                                                                                                              TREATMENT DATE:  [facility] Adult PT Treatment:                                                 DATE: 09/30/2023 Therapeutic Exercise:  Seated figure-4 ankle inversion with  red 2 x 20  Longsitting ankle eversion with red 2x20  Standing hip abduction 2x10 RTB  SLS 2x30"  both legs  Tandem stance with UE pass through with red ball 2x30" Modified side plank with clamshell using YTB 2x10 each Standing gastroc stretch x30"  Standing soleus stretch x30"  Manual:  Left intertarsal, tarsal, and talocrural mobs  Passive ankle DF and PF   DATE: 09/24/2023 Therapeutic Exercise: Seated figure-4 ankle inversion with red 2 x 20 Longsitting ankle eversion with red 2 x 20 Longsitting ankle PF with blue 2 x 20 Seated heel raise on 1/2 FR with 25# 2 x 20 Modified side plank with clamshell using yellow 2 x 10 each SLS 3 x 30 sec each Manual: Left intertarsal, tarsal, and talocrural mobs Passive ankle DF   [facility] Adult PT Treatment:                                                 DATE: 09/16/2023 Therapeutic Exercise: Seated figure-4 ankle inversion with red  Longsitting ankle eversion with red Bridge with 5 sec hold Modified side plank lift on knee with 5 sec hold  PATIENT EDUCATION:  Education details: HEP update Person educated: Patient Education method: Programmer, multimedia, Demonstration, Tactile cues, Verbal cues, and Handouts Education comprehension: verbalized understanding, returned demonstration, verbal cues required, tactile cues required, and needs further education  HOME EXERCISE PROGRAM: Access Code: [email or web address]    ASSESSMENT: CLINICAL IMPRESSION: Patient tolerated therapy well with no adverse effects. Therapy today continued to focus on left ankle mobility, strength, and control. Progressed hip strengthening to standing hip strength which additionally worked on standing balance. Patient is doing a good job noticing his cues given to him and working to make the changes. Balance continues to progress, single leg stance was done for 47 seconds on the R and 22 seconds on the left which shows  improvement compared to last session. Balance progression should continue  to improve functional activity. Head movement or UE movement still causes a stumble in balance. Patient would benefit from skilled PT to progress mobility and strength to reduce pain and continue to increase functional ability.   EVAL: Patient is a 73 y.o. male who was seen today for physical therapy evaluation and treatment for left foot and knee pain with left LE weakness following multiple left bunion surgeries and left knee meniscus tear. He primarily reports pain across the metatarsals on the left foot and pain along the medial aspect of the left arch and ankle. He does exhibit some strength deficits of the left ankle and LE compared to the right side, and exhibits limitations in his ankle mobility and fusion of the left 1st MTP this is likely a main contributor to his symptoms due to gait and movement changes. Patient does report he is likely to schedule surgery for hardware removal of left bunion surgery and left meniscectomy.   OBJECTIVE IMPAIRMENTS: Abnormal gait, decreased activity tolerance, decreased balance, decreased ROM, decreased strength, impaired flexibility, and pain.   ACTIVITY LIMITATIONS: standing and locomotion level  PARTICIPATION LIMITATIONS: community activity  PERSONAL FACTORS: Past/current experiences and Time since onset of injury/illness/exacerbation are also affecting patient's functional outcome.   GOALS: Goals reviewed with patient? Yes  SHORT TERM GOALS: Target date: 10/14/2023  Patient will be I with initial HEP in order to progress with therapy. Baseline: HEP provided at eval Goal status: INITIAL  2.  Patient will report left foot and knee pain </= 2/10 with walking in order to reduce functional limitations Baseline: 5/10 pain Goal status: INITIAL  LONG TERM GOALS: Target date: 11/11/2023  Patient will be I with final HEP to maintain progress from PT. Baseline: HEP provided at eval Goal status: INITIAL  2.  Patient will report >/= 70% status on FOTO to  indicate improved functional ability. Baseline: 57% functional status Goal status: INITIAL  3.  Patient will demonstrate left ankle strength 5/5 MMT in order to improve standing and walking tolerance Baseline: see limitations above Goal status: INITIAL  4.  Patient will demonstrate left hip strength >/= 4/5 MMT in order to improve activity tolerance Baseline: see limitations above Goal status: INITIAL  PLAN: PT FREQUENCY: 1x/week  PT DURATION: 8 weeks  PLANNED INTERVENTIONS: 97164- PT Re-evaluation, 97110-Therapeutic exercises, 97530- Therapeutic activity, 97112- Neuromuscular re-education, 97535- Self Care, 47829- Manual therapy, [id number]- Gait training, 97014- Electrical stimulation (unattended), [phone]- Electrical stimulation (manual), [phone]- Ionotophoresis 4mg /ml Dexamethasone, Patient/Family education, Balance training, Taping, Dry Needling, Joint mobilization, Joint manipulation, Cryotherapy, and Moist heat  PLAN FOR NEXT SESSION: Review HEP (specifically balance), progress strengthening for left ankle, knee, hip, core; ankle and foot mobility; work on SLS balance and tandem balance with movement.   Jesse Moritz, SPT [employer] DPT

## 2023-10-01 ENCOUNTER — Ambulatory Visit: Payer: Medicare Other | Admitting: Physical Therapy

## 2023-10-01 ENCOUNTER — Encounter: Payer: Self-pay | Admitting: Physical Therapy

## 2023-10-01 DIAGNOSIS — M6281 Muscle weakness (generalized): Secondary | ICD-10-CM

## 2023-10-01 DIAGNOSIS — M25572 Pain in left ankle and joints of left foot: Secondary | ICD-10-CM | POA: Diagnosis not present

## 2023-10-01 DIAGNOSIS — G8929 Other chronic pain: Secondary | ICD-10-CM

## 2023-10-01 NOTE — Patient Instructions (Signed)
 Access Code: [email or web address] URL: [email or web address] Date: 10/01/2023 Prepared by: Leah Primus  Exercises - Seated Figure 4 Ankle Inversion with Resistance  - 1 x daily - 3 sets - 20 reps - Long Sitting Ankle Eversion with Resistance  - 1 x daily - 3 sets - 20 reps - Long Sitting Ankle Plantar Flexion with Resistance  - 1 x daily - 3 sets - 20 reps - Supine Bridge with Mini Swiss Ball Between Knees  - 1 x daily - 2 sets - 10 reps - 5 seconds hold - Side Plank with Clam and Resistance  - 1 x daily - 2 sets - 10 reps - Single Leg Stance  - 1 x daily - 3 reps - 30 seconds hold - Standing Hip Abduction with Resistance at Ankles and Counter Support  - 1 x daily - 3 sets - 10 reps

## 2023-10-07 NOTE — Therapy (Unsigned)
OUTPATIENT PHYSICAL THERAPY TREATMENT   Patient Name: Tyrel Vandruff MRN: [id number] DOB:10-27-1950, 73 y.o., male Today's Date: 10/08/2023   END OF SESSION:  PT End of Session - 10/08/23 0840     Visit Number 4    Number of Visits 9    Date for PT Re-Evaluation 11/11/23    Authorization Type MCR / BCBS    PT Start Time 0845    PT Stop Time 0930    PT Time Calculation (min) 45 min    Activity Tolerance Patient tolerated treatment well    Behavior During Therapy [facility] for tasks assessed/performed             Past Medical History:  Diagnosis Date   Dislocated shoulder    History reviewed. No pertinent surgical history. There are no active problems to display for this patient.   PCP: Barbie Banner, MD  REFERRING PROVIDER: Edwin Cap, DPM  REFERRING DIAG: Muscle weakness  THERAPY DIAG:  Pain in left ankle and joints of left foot  Chronic pain of left knee  Muscle weakness (generalized)  Rationale for Evaluation and Treatment: Rehabilitation  ONSET DATE: November 2023  SUBJECTIVE:  SUBJECTIVE STATEMENT: Patient reports no changes since last session. He continues to be happy with PT and notices an increase in his strength. His pain has stopped being consistent which he is happy about. Reports continuing to progress with HEP at home.   EVAL: Patient reports he had a bunion surgery in November 2023, nonunion and had a re-do in May 2024. As a consequence of the bunion surgery he is experiencing significant pain in the left foot and meniscus tears in his left knee. He feels he has lost a lot of muscle which is putting more stress on the left foot. He does wear decent shoes and only does some low grade hiking for exercises. He does constantly feel pain with walking and whenever he gets up from sitting it takes about 20 steps for the ankle/foot to warm up.   PERTINENT HISTORY: Two previous left bunion surgeries, left meniscus tear  PAIN:  Are you having pain?  Yes:  NPRS scale: 5/10 Pain location: Left foot, knee Pain description: Aching, sharp Aggravating factors: Walking, standing Relieving factors: Rest  PRECAUTIONS: None  PATIENT GOALS: Pain relief so can improve activity level   OBJECTIVE:  Note: Objective measures were completed at Evaluation unless otherwise noted. PATIENT SURVEYS:  FOTO 57% functional status  MUSCLE LENGTH: Left calf tightness  POSTURE:   Patient demonstrates pes planus of left foot  PALPATION: Tender to palpation across   LOWER EXTREMITY ROM:  Active ROM Right eval Left eval Left 1/22  Hip flexion     Hip extension     Hip abduction     Hip adduction     Hip internal rotation     Hip external rotation     Knee flexion  135   Knee extension  0   Ankle dorsiflexion  8 15  Ankle plantarflexion  55 55  Ankle inversion  35   Ankle eversion  15    (Blank rows = not tested)  Patient with fusion 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 so unable to flex/extend joint  LOWER EXTREMITY MMT:  MMT Right eval Left eval  Hip flexion    Hip extension 4- 4-  Hip abduction 4 4-  Hip adduction 4 4  Hip internal rotation    Hip external rotation    Knee flexion 5 5  Knee extension 5 5  Ankle dorsiflexion 5 5  Ankle plantarflexion 5 4  Ankle inversion 5 4  Ankle eversion 5 4   (Blank rows = not tested)  FUNCTIONAL TESTS:  09/23/2022: SLS - < 30 seconds on each side, increased sway and difficulty with occasional loss of balance bilaterally   GAIT: Assistive device utilized: None Level of assistance: Complete Independence Comments: Left toe out, impaired toe off on left                                                                                                              TREATMENT DATE:   Date: 10/08/2023  Therapeutic Exercise: Elliptical x59min Seated figure-4 ankle inversion with green 1x20  Longsitting ankle eversion with green 1x20  Calmshell with RTB 2x10 Standing monster walks 2 laps with RTB   Tandeum stance on airex pad with ball pass through 2x30" SLS on airex 2x15" Seated heel raises to fatigue (30) x1 each leg #15  Patient instructed on using tennis ball at home to roll out plantar fascia Manual:  Left intertarsal, tarsal, and talocrural mobs  Passive ankle DF and PF  [facility] Adult PT Treatment:                                                 DATE: 09/30/2023 Therapeutic Exercise:  Seated figure-4 ankle inversion with red 2 x 20  Longsitting ankle eversion with red 2x20  Standing hip abduction 2x10 RTB  SLS 2x30"  both legs  Tandem stance with UE pass through with red ball 2x30" Modified side plank with clamshell using YTB 2x10 each Standing gastroc stretch x30"  Standing soleus stretch x30"  Manual:  Left intertarsal, tarsal, and talocrural mobs  Passive ankle DF and PF   DATE: 09/24/2023 Therapeutic Exercise: Seated figure-4 ankle inversion with red 2 x 20 Longsitting ankle eversion with red 2 x 20 Longsitting ankle PF with blue 2 x 20 Seated heel raise on 1/2 FR with 25# 2 x 20 Modified side plank with clamshell using yellow 2 x 10 each SLS 3 x 30 sec each Manual: Left intertarsal, tarsal, and talocrural mobs Passive ankle DF   [facility] Adult PT Treatment:                                                 DATE: 09/16/2023 Therapeutic Exercise: Seated figure-4 ankle inversion with red  Longsitting ankle eversion with red Bridge with 5 sec hold Modified side plank lift on knee with 5 sec hold  PATIENT EDUCATION:  Education details: HEP update Person educated: Patient Education method: Explanation, Demonstration, Actor cues, Verbal cues, and Handouts Education comprehension: verbalized understanding, returned demonstration, verbal cues required, tactile cues required, and needs further education  HOME EXERCISE PROGRAM:  Access Code: [email or web address]    ASSESSMENT: CLINICAL IMPRESSION: Patient tolerated therapy well with no adverse effects. Continues to be very  complaint with HEP at home. Therapy today continued to focus on left ankle mobility, strength, and control. Mobility of the ankle shows progression with dorsiflexion measuring 15 degrees of dorsiflexion compared to 8 degrees at last measurement. Pain has decreased in consistency and intensity, rating pain a 3/10 when walking in comparison to a 5/10. Therapy today continued to progress activity focusing on an increase in strength and control. Patient continues to be active in therapy and engaged in his cuing. Balance progressed to using a airex pad today and adding distraction during tandem stance. Dual tasking still provides challenges to balance. Patient would benefit from skilled PT to continue progressing mobility and strength to reduce pain and continue to increase functional ability.  EVAL: Patient is a 73 y.o. male who was seen today for physical therapy evaluation and treatment for left foot and knee pain with left LE weakness following multiple left bunion surgeries and left knee meniscus tear. He primarily reports pain across the metatarsals on the left foot and pain along the medial aspect of the left arch and ankle. He does exhibit some strength deficits of the left ankle and LE compared to the right side, and exhibits limitations in his ankle mobility and fusion of the left 1st MTP this is likely a main contributor to his symptoms due to gait and movement changes. Patient does report he is likely to schedule surgery for hardware removal of left bunion surgery and left meniscectomy.   OBJECTIVE IMPAIRMENTS: Abnormal gait, decreased activity tolerance, decreased balance, decreased ROM, decreased strength, impaired flexibility, and pain.   ACTIVITY LIMITATIONS: standing and locomotion level  PARTICIPATION LIMITATIONS: community activity  PERSONAL FACTORS: Past/current experiences and Time since onset of injury/illness/exacerbation are also affecting patient's functional outcome.   GOALS: Goals  reviewed with patient? Yes  SHORT TERM GOALS: Target date: 10/14/2023  Patient will be I with initial HEP in order to progress with therapy. Baseline: HEP provided at eval Goal status: INITIAL  2.  Patient will report left foot and knee pain </= 2/10 with walking in order to reduce functional limitations Baseline: 5/10 pain Goal status: INITIAL  LONG TERM GOALS: Target date: 11/11/2023  Patient will be I with final HEP to maintain progress from PT. Baseline: HEP provided at eval Goal status: INITIAL  2.  Patient will report >/= 70% status on FOTO to indicate improved functional ability. Baseline: 57% functional status Goal status: INITIAL  3.  Patient will demonstrate left ankle strength 5/5 MMT in order to improve standing and walking tolerance Baseline: see limitations above Goal status: INITIAL  4.  Patient will demonstrate left hip strength >/= 4/5 MMT in order to improve activity tolerance Baseline: see limitations above Goal status: INITIAL  PLAN: PT FREQUENCY: 1x/week  PT DURATION: 8 weeks  PLANNED INTERVENTIONS: 97164- PT Re-evaluation, 97110-Therapeutic exercises, 97530- Therapeutic activity, 97112- Neuromuscular re-education, 97535- Self Care, 40981- Manual therapy, [id number]- Gait training, 97014- Electrical stimulation (unattended), [id number]- Electrical stimulation (manual), [id number]- Ionotophoresis 4mg /ml Dexamethasone, Patient/Family education, Balance training, Taping, Dry Needling, Joint mobilization, Joint manipulation, Cryotherapy, and Moist heat  PLAN FOR NEXT SESSION: Review HEP, progress strengthening for left ankle, knee, hip, core; ankle and foot mobility; work on SLS balance and tandem balance with movement.   Erin Hearing, Student-PT 10/08/2023, 10:17 AM

## 2023-10-08 ENCOUNTER — Encounter: Payer: Self-pay | Admitting: Physical Therapy

## 2023-10-08 ENCOUNTER — Ambulatory Visit: Payer: Medicare Other | Admitting: Physical Therapy

## 2023-10-08 DIAGNOSIS — M25572 Pain in left ankle and joints of left foot: Secondary | ICD-10-CM

## 2023-10-08 DIAGNOSIS — G8929 Other chronic pain: Secondary | ICD-10-CM

## 2023-10-08 DIAGNOSIS — M6281 Muscle weakness (generalized): Secondary | ICD-10-CM

## 2023-10-15 ENCOUNTER — Ambulatory Visit: Payer: Medicare Other | Admitting: Physical Therapy

## 2023-10-15 ENCOUNTER — Encounter: Payer: Self-pay | Admitting: Physical Therapy

## 2023-10-15 DIAGNOSIS — M25572 Pain in left ankle and joints of left foot: Secondary | ICD-10-CM | POA: Diagnosis not present

## 2023-10-15 DIAGNOSIS — G8929 Other chronic pain: Secondary | ICD-10-CM

## 2023-10-15 NOTE — Therapy (Signed)
OUTPATIENT PHYSICAL THERAPY TREATMENT   Patient Name: Dominic Marks MRN: [id number] DOB:17-Mar-1951, 73 y.o., male Today's Date: 10/15/2023   END OF SESSION:  PT End of Session - 10/15/23 0846     Visit Number 5    Number of Visits 9    Date for PT Re-Evaluation 11/11/23    Authorization Type MCR / BCBS    Progress Note Due on Visit 10    PT Start Time [phone]    PT Stop Time 0930    PT Time Calculation (min) 44 min             Past Medical History:  Diagnosis Date   Dislocated shoulder    History reviewed. No pertinent surgical history. There are no active problems to display for this patient.   PCP: Barbie Banner, MD  REFERRING PROVIDER: Edwin Cap, DPM  REFERRING DIAG: Muscle weakness  THERAPY DIAG:  Pain in left ankle and joints of left foot  Chronic pain of left knee  Rationale for Evaluation and Treatment: Rehabilitation  ONSET DATE: November 2023  SUBJECTIVE:  SUBJECTIVE STATEMENT: Pt reports a little "crankiness in the foot" . Maybe strained the left knee possibly the monster walk? Pain is intermittent 3/10 knee and foot.    EVAL: Patient reports he had a bunion surgery in November 2023, nonunion and had a re-do in May 2024. As a consequence of the bunion surgery he is experiencing significant pain in the left foot and meniscus tears in his left knee. He feels he has lost a lot of muscle which is putting more stress on the left foot. He does wear decent shoes and only does some low grade hiking for exercises. He does constantly feel pain with walking and whenever he gets up from sitting it takes about 20 steps for the ankle/foot to warm up.   PERTINENT HISTORY: Two previous left bunion surgeries, left meniscus tear  PAIN:  Are you having pain? Yes:  NPRS scale: 3/10 Pain location: Left foot,left knee Pain description: Aching, sharp Aggravating factors: Walking, standing Relieving factors: Rest  PRECAUTIONS: None  PATIENT GOALS: Pain  relief so can improve activity level   OBJECTIVE:  Note: Objective measures were completed at Evaluation unless otherwise noted. PATIENT SURVEYS:  FOTO 57% functional status  MUSCLE LENGTH: Left calf tightness  POSTURE:   Patient demonstrates pes planus of left foot  PALPATION: Tender to palpation across   LOWER EXTREMITY ROM:  Active ROM Right eval Left eval Left 1/22  Hip flexion     Hip extension     Hip abduction     Hip adduction     Hip internal rotation     Hip external rotation     Knee flexion  135   Knee extension  0   Ankle dorsiflexion  8 15  Ankle plantarflexion  55 55  Ankle inversion  35   Ankle eversion  15    (Blank rows = not tested)  Patient with fusion 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 so unable to flex/extend joint  LOWER EXTREMITY MMT:  MMT Right eval Left eval  Hip flexion    Hip extension 4- 4-  Hip abduction 4 4-  Hip adduction 4 4  Hip internal rotation    Hip external rotation    Knee flexion 5 5  Knee extension 5 5  Ankle dorsiflexion 5 5  Ankle plantarflexion 5 4  Ankle inversion 5 4  Ankle eversion 5 4   (Blank rows =  not tested)  FUNCTIONAL TESTS:  09/23/2022: SLS - < 30 seconds on each side, increased sway and difficulty with occasional loss of balance bilaterally   GAIT: Assistive device utilized: None Level of assistance: Complete Independence Comments: Left toe out, impaired toe off on left                                                                                                              TREATMENT DATE:   [facility] Adult PT Treatment:                                                DATE: 10/15/23 Therapeutic Exercise: Elliptical L3 R3 x 5 minutes 1/2 each direction GTB Inve and ever x 20 each long sitting Toe scrunches/ toe extensions, Toe Yoga SLS with ABC draw using exercise ball each LE Blue Rocker board A/P and lateral without UE x 60 each  Hurdles alternating step over - slow , laterals  SLS on AIREX  Manual  Therapy: Metatarsal mobs     Date: 10/08/2023  Therapeutic Exercise: Elliptical x3min Seated figure-4 ankle inversion with green 1x20  Longsitting ankle eversion with green 1x20  Calmshell with RTB 2x10 Standing monster walks 2 laps with RTB  Tandeum stance on airex pad with ball pass through 2x30" SLS on airex 2x15" Seated heel raises to fatigue (30) x1 each leg #15  Patient instructed on using tennis ball at home to roll out plantar fascia Manual:  Left intertarsal, tarsal, and talocrural mobs  Passive ankle DF and PF  [facility] Adult PT Treatment:                                                 DATE: 09/30/2023 Therapeutic Exercise:  Seated figure-4 ankle inversion with red 2 x 20  Longsitting ankle eversion with red 2x20  Standing hip abduction 2x10 RTB  SLS 2x30"  both legs  Tandem stance with UE pass through with red ball 2x30" Modified side plank with clamshell using YTB 2x10 each Standing gastroc stretch x30"  Standing soleus stretch x30"  Manual:  Left intertarsal, tarsal, and talocrural mobs  Passive ankle DF and PF   DATE: 09/24/2023 Therapeutic Exercise: Seated figure-4 ankle inversion with red 2 x 20 Longsitting ankle eversion with red 2 x 20 Longsitting ankle PF with blue 2 x 20 Seated heel raise on 1/2 FR with 25# 2 x 20 Modified side plank with clamshell using yellow 2 x 10 each SLS 3 x 30 sec each Manual: Left intertarsal, tarsal, and talocrural mobs Passive ankle DF   [facility] Adult PT Treatment:  DATE: 09/16/2023 Therapeutic Exercise: Seated figure-4 ankle inversion with red  Longsitting ankle eversion with red Bridge with 5 sec hold Modified side plank lift on knee with 5 sec hold  PATIENT EDUCATION:  Education details: HEP update Person educated: Patient Education method: Explanation, Demonstration, Tactile cues, Verbal cues, and Handouts Education comprehension: verbalized understanding, returned  demonstration, verbal cues required, tactile cues required, and needs further education  HOME EXERCISE PROGRAM: Access Code: [email or web address]    ASSESSMENT: CLINICAL IMPRESSION: Patient tolerated therapy well with no adverse effects. Continues to be very complaint with HEP at home. Therapy today continued to focus on left ankle mobility, strength, and control. Pain has decreased in consistency and intensity, rating pain a 3/10 when walking in comparison to a 5/10 although he notes an increase in knee strain today.    Dynamic UE movements add challenge to balance. Added intrinsic foot strengthening today as pt reports his anterior foot/metatarsals are most bothersome.  Patient would benefit from skilled PT to continue progressing mobility and strength to reduce pain and continue to increase functional ability.  EVAL: Patient is a 73 y.o. male who was seen today for physical therapy evaluation and treatment for left foot and knee pain with left LE weakness following multiple left bunion surgeries and left knee meniscus tear. He primarily reports pain across the metatarsals on the left foot and pain along the medial aspect of the left arch and ankle. He does exhibit some strength deficits of the left ankle and LE compared to the right side, and exhibits limitations in his ankle mobility and fusion of the left 1st MTP this is likely a main contributor to his symptoms due to gait and movement changes. Patient does report he is likely to schedule surgery for hardware removal of left bunion surgery and left meniscectomy.   OBJECTIVE IMPAIRMENTS: Abnormal gait, decreased activity tolerance, decreased balance, decreased ROM, decreased strength, impaired flexibility, and pain.   ACTIVITY LIMITATIONS: standing and locomotion level  PARTICIPATION LIMITATIONS: community activity  PERSONAL FACTORS: Past/current experiences and Time since onset of injury/illness/exacerbation are also affecting patient's functional  outcome.   GOALS: Goals reviewed with patient? Yes  SHORT TERM GOALS: Target date: 10/14/2023  Patient will be I with initial HEP in order to progress with therapy. Baseline: HEP provided at eval Goal status: INITIAL  2.  Patient will report left foot and knee pain </= 2/10 with walking in order to reduce functional limitations Baseline: 5/10 pain Goal status: INITIAL  LONG TERM GOALS: Target date: 11/11/2023  Patient will be I with final HEP to maintain progress from PT. Baseline: HEP provided at eval Goal status: INITIAL  2.  Patient will report >/= 70% status on FOTO to indicate improved functional ability. Baseline: 57% functional status Goal status: INITIAL  3.  Patient will demonstrate left ankle strength 5/5 MMT in order to improve standing and walking tolerance Baseline: see limitations above Goal status: INITIAL  4.  Patient will demonstrate left hip strength >/= 4/5 MMT in order to improve activity tolerance Baseline: see limitations above Goal status: INITIAL  PLAN: PT FREQUENCY: 1x/week  PT DURATION: 8 weeks  PLANNED INTERVENTIONS: 97164- PT Re-evaluation, 97110-Therapeutic exercises, 97530- Therapeutic activity, 97112- Neuromuscular re-education, 97535- Self Care, 21308- Manual therapy, [id number]- Gait training, 97014- Electrical stimulation (unattended), [id number]- Electrical stimulation (manual), [id number]- Ionotophoresis 4mg /ml Dexamethasone, Patient/Family education, Balance training, Taping, Dry Needling, Joint mobilization, Joint manipulation, Cryotherapy, and Moist heat  PLAN FOR NEXT SESSION: Review HEP, progress strengthening for left ankle, knee, hip,  core; ankle and foot mobility; work on SLS balance and tandem balance with movement.   Jannette Spanner, PTA 10/15/23 1:28 PM Phone: [phone] Fax: [phone]

## 2023-10-24 ENCOUNTER — Ambulatory Visit: Payer: Medicare Other | Attending: Podiatry | Admitting: Physical Therapy

## 2023-10-24 ENCOUNTER — Encounter: Payer: Self-pay | Admitting: Physical Therapy

## 2023-10-24 ENCOUNTER — Other Ambulatory Visit: Payer: Self-pay

## 2023-10-24 DIAGNOSIS — M6281 Muscle weakness (generalized): Secondary | ICD-10-CM | POA: Diagnosis present

## 2023-10-24 DIAGNOSIS — G8929 Other chronic pain: Secondary | ICD-10-CM | POA: Diagnosis present

## 2023-10-24 DIAGNOSIS — M25572 Pain in left ankle and joints of left foot: Secondary | ICD-10-CM | POA: Insufficient documentation

## 2023-10-24 DIAGNOSIS — M25562 Pain in left knee: Secondary | ICD-10-CM | POA: Insufficient documentation

## 2023-10-24 NOTE — Patient Instructions (Signed)
 Access Code: [email or web address] URL: [email or web address] Date: 10/24/2023 Prepared by: Elaine Daring  Exercises - Seated Figure 4 Ankle Inversion with Resistance  - 1 x daily - 3 sets - 20 reps - Long Sitting Ankle Eversion with Resistance  - 1 x daily - 3 sets - 20 reps - Long Sitting Ankle Plantar Flexion with Resistance  - 1 x daily - 3 sets - 20 reps - Supine Bridge with Mini Swiss Ball Between Knees  - 1 x daily - 2 sets - 10 reps - 5 seconds hold - Side Plank with Clam and Resistance  - 1 x daily - 2 sets - 10 reps - Single Leg Stance  - 1 x daily - 3 reps - 30 seconds hold - Side Stepping with Resistance at Ankles  - 1 x daily - 3 sets - 20 reps - Wall Squat  - 1 x daily - 3-5 reps - 30 seconds hold

## 2023-10-24 NOTE — Therapy (Signed)
 OUTPATIENT PHYSICAL THERAPY TREATMENT   Patient Name: Dominic Marks MRN: [id number] DOB:05-23-1951, 73 y.o., male Today's Date: 10/24/2023   END OF SESSION:  PT End of Session - 10/24/23 0859     Visit Number 6    Number of Visits 9    Date for PT Re-Evaluation 11/11/23    Authorization Type MCR / BCBS    Progress Note Due on Visit 10    PT Start Time 0848    PT Stop Time 0930    PT Time Calculation (min) 42 min    Activity Tolerance Patient tolerated treatment well    Behavior During Therapy WFL for tasks assessed/performed              Past Medical History:  Diagnosis Date   Dislocated shoulder    History reviewed. No pertinent surgical history. There are no active problems to display for this patient.   PCP: Tanda Prentice DEL, MD  REFERRING PROVIDER: Silva Juliene SAUNDERS, DPM  REFERRING DIAG: Muscle weakness  THERAPY DIAG:  Pain in left ankle and joints of left foot  Chronic pain of left knee  Muscle weakness (generalized)  Rationale for Evaluation and Treatment: Rehabilitation  ONSET DATE: November 2023   SUBJECTIVE:  SUBJECTIVE STATEMENT: Patient reports his balance and range of motion are getting better, but the discomfort is also ramping up as well. The discomfort is doable and not debilitating, but is happening more often.   EVAL: Patient reports he had a bunion surgery in November 2023, nonunion and had a re-do in May 2024. As a consequence of the bunion surgery he is experiencing significant pain in the left foot and meniscus tears in his left knee. He feels he has lost a lot of muscle which is putting more stress on the left foot. He does wear decent shoes and only does some low grade hiking for exercises. He does constantly feel pain with walking and whenever he gets up from sitting it takes about 20 steps for the ankle/foot to warm up.   PERTINENT HISTORY: Two previous left bunion surgeries, left meniscus tear  PAIN:  Are you having pain? Yes:   NPRS scale: 3/10 Pain location: Left foot, left knee Pain description: Aching, sharp Aggravating factors: Walking, standing Relieving factors: Rest  PRECAUTIONS: None  PATIENT GOALS: Pain relief so can improve activity level   OBJECTIVE:  Note: Objective measures were completed at Evaluation unless otherwise noted. PATIENT SURVEYS:  FOTO 57% functional status  10/24/2023: 77%  MUSCLE LENGTH: Left calf tightness  POSTURE:   Patient demonstrates pes planus of left foot  PALPATION: Tender to palpation across   LOWER EXTREMITY ROM:  Active ROM Right eval Left eval Left 1/22 Left 10/24/2023  Hip flexion      Hip extension      Hip abduction      Hip adduction      Hip internal rotation      Hip external rotation      Knee flexion  135    Knee extension  0    Ankle dorsiflexion  8 15 20   Ankle plantarflexion  55 55 58  Ankle inversion  35  35  Ankle eversion  15  25   (Blank rows = not tested)  Patient with fusion 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 so unable to flex/extend joint  LOWER EXTREMITY MMT:  MMT Right eval Left eval  Hip flexion    Hip extension 4- 4-  Hip abduction 4 4-  Hip  adduction 4 4  Hip internal rotation    Hip external rotation    Knee flexion 5 5  Knee extension 5 5  Ankle dorsiflexion 5 5  Ankle plantarflexion 5 4  Ankle inversion 5 4  Ankle eversion 5 4   (Blank rows = not tested)  FUNCTIONAL TESTS:  09/23/2022: SLS - < 30 seconds on each side, increased sway and difficulty with occasional loss of balance bilaterally   GAIT: Assistive device utilized: None Level of assistance: Complete Independence Comments: Left toe out, impaired toe off on left                                                                                                              TREATMENT DATE:   [facility] PT Treatment:                                                DATE: 10/24/2023 Elliptical L5 R1 x 5 min to improve LE endurance and workload capacity Standing heel  raise on 1/2 FR 3 x 20 Lateral band walk with green at mid shin 3 x 20 down/back Goblet squat with 25# KB 3 x 10 Wall sit x 30 sec Rear foot elevated split squat 2 x 10 each SLS on Airex 3 x 30 sec each  Patient education provided on home exercise progressions and rationale for reintroducing LE strengthening in smaller doses and gradually increasing intensity as tolerated, also educated on methods for challenging SL balance without using foam pad at home   [facility] Adult PT Treatment:                                                DATE: 10/15/23 Therapeutic Exercise: Elliptical L3 R3 x 5 minutes 1/2 each direction GTB Inve and ever x 20 each long sitting Toe scrunches/ toe extensions, Toe Yoga SLS with ABC draw using exercise ball each LE Blue Rocker board A/P and lateral without UE x 60 each  Hurdles alternating step over - slow , laterals  SLS on AIREX  Manual Therapy: Metatarsal mobs   Date: 10/08/2023  Therapeutic Exercise: Elliptical x13min Seated figure-4 ankle inversion with green 1x20  Longsitting ankle eversion with green 1x20  Calmshell with RTB 2x10 Standing monster walks 2 laps with RTB  Tandeum stance on airex pad with ball pass through 2x30 SLS on airex 2x15 Seated heel raises to fatigue (30) x1 each leg #15  Patient instructed on using tennis ball at home to roll out plantar fascia Manual:  Left intertarsal, tarsal, and talocrural mobs  Passive ankle DF and PF  PATIENT EDUCATION:  Education details: HEP update Person educated: Patient Education method: Explanation, Demonstration, Tactile cues, Verbal cues Education comprehension: verbalized understanding, returned demonstration, verbal cues required, tactile  cues required, and needs further education  HOME EXERCISE PROGRAM: Access Code: [email or web address]    ASSESSMENT: CLINICAL IMPRESSION: Patient tolerated therapy well with no adverse effects. Therapy continues to focus on progressing LE strength and balance with  good tolerance. He was able to tolerate progression of squatting with weight and incorporated more SL strengthening. Demonstrated wall sit as this was something patient reports he used to do at home but stopped due to knee pain, so he was educated on gradual progression and technique modifications to avoid exacerbating knee pain. Updated his HEP to progress his LE strength and endurance with good tolerance. Patient would benefit from skilled PT to continue progressing mobility and strength to reduce pain and continue to increase functional ability.   EVAL: Patient is a 73 y.o. male who was seen today for physical therapy evaluation and treatment for left foot and knee pain with left LE weakness following multiple left bunion surgeries and left knee meniscus tear. He primarily reports pain across the metatarsals on the left foot and pain along the medial aspect of the left arch and ankle. He does exhibit some strength deficits of the left ankle and LE compared to the right side, and exhibits limitations in his ankle mobility and fusion of the left 1st MTP this is likely a main contributor to his symptoms due to gait and movement changes. Patient does report he is likely to schedule surgery for hardware removal of left bunion surgery and left meniscectomy.   OBJECTIVE IMPAIRMENTS: Abnormal gait, decreased activity tolerance, decreased balance, decreased ROM, decreased strength, impaired flexibility, and pain.   ACTIVITY LIMITATIONS: standing and locomotion level  PARTICIPATION LIMITATIONS: community activity  PERSONAL FACTORS: Past/current experiences and Time since onset of injury/illness/exacerbation are also affecting patient's functional outcome.   GOALS: Goals reviewed with patient? Yes  SHORT TERM GOALS: Target date: 10/14/2023  Patient will be I with initial HEP in order to progress with therapy. Baseline: HEP provided at eval 10/24/2023: independent with initial HEP Goal status: MET  2.   Patient will report left foot and knee pain </= 2/10 with walking in order to reduce functional limitations Baseline: 5/10 pain 10/24/2023: 3/10 Goal status: ONGOING  LONG TERM GOALS: Target date: 11/11/2023  Patient will be I with final HEP to maintain progress from PT. Baseline: HEP provided at eval Goal status: INITIAL  2.  Patient will report >/= 70% status on FOTO to indicate improved functional ability. Baseline: 57% functional status 10/24/2023: 77% Goal status: MET  3.  Patient will demonstrate left ankle strength 5/5 MMT in order to improve standing and walking tolerance Baseline: see limitations above Goal status: INITIAL  4.  Patient will demonstrate left hip strength >/= 4/5 MMT in order to improve activity tolerance Baseline: see limitations above Goal status: INITIAL  PLAN: PT FREQUENCY: 1x/week  PT DURATION: 8 weeks  PLANNED INTERVENTIONS: 97164- PT Re-evaluation, 97110-Therapeutic exercises, 97530- Therapeutic activity, 97112- Neuromuscular re-education, 97535- Self Care, [id number]- Manual therapy, [id number]- Gait training, 97014- Electrical stimulation (unattended), [id number]- Electrical stimulation (manual), [phone]- Ionotophoresis 4mg /ml Dexamethasone, Patient/Family education, Balance training, Taping, Dry Needling, Joint mobilization, Joint manipulation, Cryotherapy, and Moist heat  PLAN FOR NEXT SESSION: Review HEP, progress strengthening for left ankle, knee, hip, core; ankle and foot mobility; work on SLS balance and tandem balance with movement.    Elaine Daring, PT, DPT, LAT, ATC 10/24/23  10:39 AM Phone: [phone] Fax: [phone]

## 2023-10-30 ENCOUNTER — Encounter: Payer: Self-pay | Admitting: Physical Therapy

## 2023-10-30 ENCOUNTER — Other Ambulatory Visit: Payer: Self-pay

## 2023-10-30 ENCOUNTER — Ambulatory Visit: Payer: Medicare Other | Admitting: Physical Therapy

## 2023-10-30 DIAGNOSIS — M6281 Muscle weakness (generalized): Secondary | ICD-10-CM

## 2023-10-30 DIAGNOSIS — G8929 Other chronic pain: Secondary | ICD-10-CM

## 2023-10-30 DIAGNOSIS — M25572 Pain in left ankle and joints of left foot: Secondary | ICD-10-CM

## 2023-10-30 NOTE — Therapy (Addendum)
 OUTPATIENT PHYSICAL THERAPY TREATMENT  DISCHARGE   Patient Name: Dominic Marks MRN: [id number] DOB:1951/02/25, 73 y.o., male Today's Date: 10/30/2023   END OF SESSION:  PT End of Session - 10/30/23 0850     Visit Number 7    Number of Visits 9    Date for PT Re-Evaluation 11/11/23    Authorization Type MCR / BCBS    Progress Note Due on Visit 10    PT Start Time [phone]    PT Stop Time 0930    PT Time Calculation (min) 43 min    Activity Tolerance Patient tolerated treatment well    Behavior During Therapy [facility] for tasks assessed/performed               Past Medical History:  Diagnosis Date   Dislocated shoulder    History reviewed. No pertinent surgical history. There are no active problems to display for this patient.   PCP: Barbie Banner, MD  REFERRING PROVIDER: Edwin Cap, DPM  REFERRING DIAG: Muscle weakness  THERAPY DIAG:  Pain in left ankle and joints of left foot  Chronic pain of left knee  Muscle weakness (generalized)  Rationale for Evaluation and Treatment: Rehabilitation  ONSET DATE: November 2023   SUBJECTIVE:  SUBJECTIVE STATEMENT: Patient reports he feels 80%+ normal just walking around. States exercises are going real good.   EVAL: Patient reports he had a bunion surgery in November 2023, nonunion and had a re-do in May 2024. As a consequence of the bunion surgery he is experiencing significant pain in the left foot and meniscus tears in his left knee. He feels he has lost a lot of muscle which is putting more stress on the left foot. He does wear decent shoes and only does some low grade hiking for exercises. He does constantly feel pain with walking and whenever he gets up from sitting it takes about 20 steps for the ankle/foot to warm up.   PERTINENT HISTORY: Two previous left bunion surgeries, left meniscus tear  PAIN:  Are you having pain? Yes:  NPRS scale: 3/10 Pain location: Left foot, left knee Pain description:  Aching, sharp Aggravating factors: Walking, standing Relieving factors: Rest  PRECAUTIONS: None  PATIENT GOALS: Pain relief so can improve activity level   OBJECTIVE:  Note: Objective measures were completed at Evaluation unless otherwise noted. PATIENT SURVEYS:  FOTO 57% functional status  10/24/2023: 77%  MUSCLE LENGTH: Left calf tightness  POSTURE:   Patient demonstrates pes planus of left foot  PALPATION: Tender to palpation across   LOWER EXTREMITY ROM:  Active ROM Right eval Left eval Left 1/22 Left 10/24/2023  Hip flexion      Hip extension      Hip abduction      Hip adduction      Hip internal rotation      Hip external rotation      Knee flexion  135    Knee extension  0    Ankle dorsiflexion  8 15 20   Ankle plantarflexion  55 55 58  Ankle inversion  35  35  Ankle eversion  15  25   (Blank rows = not tested)  Patient with fusion 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 so unable to flex/extend joint  LOWER EXTREMITY MMT:  MMT Right eval Left eval  Hip flexion    Hip extension 4- 4-  Hip abduction 4 4-  Hip adduction 4 4  Hip internal rotation    Hip external rotation  Knee flexion 5 5  Knee extension 5 5  Ankle dorsiflexion 5 5  Ankle plantarflexion 5 4  Ankle inversion 5 4  Ankle eversion 5 4   (Blank rows = not tested)  FUNCTIONAL TESTS:  09/23/2022: SLS - < 30 seconds on each side, increased sway and difficulty with occasional loss of balance bilaterally   GAIT: Assistive device utilized: None Level of assistance: Complete Independence Comments: Left toe out, impaired toe off on left                                                                                                              TREATMENT DATE:  [facility] Adult PT Treatment:                                                DATE: 10/30/2023 Elliptical L5 R1 x 5 min to improve LE endurance and workload capacity Wall sit 4 x 30 sec Modified side plank on knee with top hip abduction hold 5 x 10 sec  each Forward 8" runner step-up holding 10# contralaterally 2 x 10 each Standing hydrant with red and rear toe on 8" box, UE support as needed 2 x 15 each Forward lunge on BOSU x 10 each   PATIENT EDUCATION:  Education details: HEP Person educated: Patient Education method: Programmer, multimedia, Demonstration, Actor cues, Verbal cues Education comprehension: verbalized understanding, returned demonstration, verbal cues required, tactile cues required, and needs further education  HOME EXERCISE PROGRAM: Access Code: [email or web address]    ASSESSMENT: CLINICAL IMPRESSION: Patient tolerated therapy well with no adverse effects. Therapy continues to focus on progressing left LE strength and control with good tolerance. Focused more on standing and single leg exercises this visit and incorporated weight and unstable surface to challenge his control. He does exhibit occasional loss of balance and need for correction with exercises but improves with more repetitions. No changes made to his HEP this visit. Patient would benefit from skilled PT to continue progressing mobility and strength to reduce pain and continue to increase functional ability.   EVAL: Patient is a 73 y.o. male who was seen today for physical therapy evaluation and treatment for left foot and knee pain with left LE weakness following multiple left bunion surgeries and left knee meniscus tear. He primarily reports pain across the metatarsals on the left foot and pain along the medial aspect of the left arch and ankle. He does exhibit some strength deficits of the left ankle and LE compared to the right side, and exhibits limitations in his ankle mobility and fusion of the left 1st MTP this is likely a main contributor to his symptoms due to gait and movement changes. Patient does report he is likely to schedule surgery for hardware removal of left bunion surgery and left meniscectomy.   OBJECTIVE IMPAIRMENTS: Abnormal gait, decreased activity  tolerance, decreased balance, decreased ROM, decreased strength, impaired flexibility, and pain.  ACTIVITY LIMITATIONS: standing and locomotion level  PARTICIPATION LIMITATIONS: community activity  PERSONAL FACTORS: Past/current experiences and Time since onset of injury/illness/exacerbation are also affecting patient's functional outcome.   GOALS: Goals reviewed with patient? Yes  SHORT TERM GOALS: Target date: 10/14/2023  Patient will be I with initial HEP in order to progress with therapy. Baseline: HEP provided at eval 10/24/2023: independent with initial HEP Goal status: MET  2.  Patient will report left foot and knee pain </= 2/10 with walking in order to reduce functional limitations Baseline: 5/10 pain 10/24/2023: 3/10 Goal status: ONGOING  LONG TERM GOALS: Target date: 11/11/2023  Patient will be I with final HEP to maintain progress from PT. Baseline: HEP provided at eval Goal status: INITIAL  2.  Patient will report >/= 70% status on FOTO to indicate improved functional ability. Baseline: 57% functional status 10/24/2023: 77% Goal status: MET  3.  Patient will demonstrate left ankle strength 5/5 MMT in order to improve standing and walking tolerance Baseline: see limitations above Goal status: INITIAL  4.  Patient will demonstrate left hip strength >/= 4/5 MMT in order to improve activity tolerance Baseline: see limitations above Goal status: INITIAL  PLAN: PT FREQUENCY: 1x/week  PT DURATION: 8 weeks  PLANNED INTERVENTIONS: 97164- PT Re-evaluation, 97110-Therapeutic exercises, 97530- Therapeutic activity, 97112- Neuromuscular re-education, 97535- Self Care, 04540- Manual therapy, [id number]- Gait training, 97014- Electrical stimulation (unattended), [id number]- Electrical stimulation (manual), [phone]- Ionotophoresis 4mg /ml Dexamethasone, Patient/Family education, Balance training, Taping, Dry Needling, Joint mobilization, Joint manipulation, Cryotherapy, and Moist heat  PLAN  FOR NEXT SESSION: Review HEP, progress strengthening for left ankle, knee, hip, core; ankle and foot mobility; work on SLS balance and tandem balance with movement.    Rosana Hoes, PT, DPT, LAT, ATC 10/30/23  11:17 AM Phone: [phone] Fax: [phone]    PHYSICAL THERAPY DISCHARGE SUMMARY  Visits from Start of Care: 7  Current functional level related to goals / functional outcomes: See above   Remaining deficits: See above   Education / Equipment: HEP   Patient agrees to discharge. Patient goals were partially met. Patient is being discharged due to being pleased with the current functional level.  Rosana Hoes, PT, DPT, LAT, ATC 11/26/23  1:42 PM Phone: [phone] Fax: [phone]

## 2023-11-06 ENCOUNTER — Ambulatory Visit: Payer: Medicare Other | Admitting: Physical Therapy

## 2023-11-13 ENCOUNTER — Ambulatory Visit: Payer: Medicare Other | Admitting: Physical Therapy
